# Patient Record
Sex: Female | Born: 1955 | Race: Black or African American | Hispanic: No | Marital: Married | Smoking: Never smoker
Health system: Southern US, Community
[De-identification: ages and names within clinical notes are randomized; demographics above are authoritative.]

## PROBLEM LIST (undated history)

## (undated) DIAGNOSIS — Z9889 Other specified postprocedural states: Secondary | ICD-10-CM

## (undated) DIAGNOSIS — M1990 Unspecified osteoarthritis, unspecified site: Secondary | ICD-10-CM

## (undated) DIAGNOSIS — K8020 Calculus of gallbladder without cholecystitis without obstruction: Secondary | ICD-10-CM

## (undated) DIAGNOSIS — K219 Gastro-esophageal reflux disease without esophagitis: Secondary | ICD-10-CM

## (undated) DIAGNOSIS — Z8489 Family history of other specified conditions: Secondary | ICD-10-CM

## (undated) DIAGNOSIS — F419 Anxiety disorder, unspecified: Secondary | ICD-10-CM

## (undated) DIAGNOSIS — R112 Nausea with vomiting, unspecified: Secondary | ICD-10-CM

## (undated) DIAGNOSIS — K635 Polyp of colon: Secondary | ICD-10-CM

## (undated) DIAGNOSIS — D649 Anemia, unspecified: Secondary | ICD-10-CM

## (undated) HISTORY — DX: Anxiety disorder, unspecified: F41.9

## (undated) HISTORY — DX: Gastro-esophageal reflux disease without esophagitis: K21.9

## (undated) HISTORY — DX: Unspecified osteoarthritis, unspecified site: M19.90

## (undated) HISTORY — DX: Polyp of colon: K63.5

## (undated) HISTORY — DX: Calculus of gallbladder without cholecystitis without obstruction: K80.20

## (undated) HISTORY — PX: TUBAL LIGATION: SHX77

## (undated) HISTORY — PX: OTHER SURGICAL HISTORY: SHX169

---
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
## 1999-02-08 ENCOUNTER — Ambulatory Visit (HOSPITAL_COMMUNITY): Admission: RE | Admit: 1999-02-08 | Discharge: 1999-02-08 | Payer: Self-pay | Admitting: Family Medicine

## 1999-10-27 ENCOUNTER — Other Ambulatory Visit: Admission: RE | Admit: 1999-10-27 | Discharge: 1999-10-27 | Payer: Self-pay | Admitting: Obstetrics and Gynecology

## 2000-08-06 ENCOUNTER — Encounter: Admission: RE | Admit: 2000-08-06 | Discharge: 2000-08-06 | Payer: Self-pay | Admitting: Family Medicine

## 2000-08-06 ENCOUNTER — Ambulatory Visit (HOSPITAL_COMMUNITY): Admission: RE | Admit: 2000-08-06 | Discharge: 2000-08-06 | Payer: Self-pay | Admitting: Family Medicine

## 2000-08-29 ENCOUNTER — Other Ambulatory Visit: Admission: RE | Admit: 2000-08-29 | Discharge: 2000-08-29 | Payer: Self-pay | Admitting: Obstetrics and Gynecology

## 2000-11-20 ENCOUNTER — Encounter: Admission: RE | Admit: 2000-11-20 | Discharge: 2000-11-20 | Payer: Self-pay | Admitting: Family Medicine

## 2000-12-25 HISTORY — PX: KNEE ARTHROSCOPY: SUR90

## 2001-01-30 ENCOUNTER — Ambulatory Visit (HOSPITAL_BASED_OUTPATIENT_CLINIC_OR_DEPARTMENT_OTHER): Admission: RE | Admit: 2001-01-30 | Discharge: 2001-01-30 | Payer: Self-pay | Admitting: Orthopedic Surgery

## 2001-04-02 ENCOUNTER — Encounter: Admission: RE | Admit: 2001-04-02 | Discharge: 2001-04-02 | Payer: Self-pay | Admitting: Family Medicine

## 2003-01-14 ENCOUNTER — Encounter (INDEPENDENT_AMBULATORY_CARE_PROVIDER_SITE_OTHER): Payer: Self-pay | Admitting: Specialist

## 2003-01-14 ENCOUNTER — Encounter: Admission: RE | Admit: 2003-01-14 | Discharge: 2003-01-14 | Payer: Self-pay | Admitting: Family Medicine

## 2003-01-14 ENCOUNTER — Ambulatory Visit (HOSPITAL_COMMUNITY): Admission: RE | Admit: 2003-01-14 | Discharge: 2003-01-14 | Payer: Self-pay | Admitting: Family Medicine

## 2003-01-23 ENCOUNTER — Encounter: Admission: RE | Admit: 2003-01-23 | Discharge: 2003-01-23 | Payer: Self-pay | Admitting: Family Medicine

## 2003-01-23 ENCOUNTER — Encounter: Payer: Self-pay | Admitting: Family Medicine

## 2003-08-27 ENCOUNTER — Encounter: Admission: RE | Admit: 2003-08-27 | Discharge: 2003-08-27 | Payer: Self-pay | Admitting: Family Medicine

## 2003-09-11 ENCOUNTER — Encounter: Payer: Self-pay | Admitting: Emergency Medicine

## 2003-09-11 ENCOUNTER — Emergency Department (HOSPITAL_COMMUNITY): Admission: EM | Admit: 2003-09-11 | Discharge: 2003-09-11 | Payer: Self-pay | Admitting: Emergency Medicine

## 2003-09-15 ENCOUNTER — Encounter: Admission: RE | Admit: 2003-09-15 | Discharge: 2003-10-26 | Payer: Self-pay | Admitting: Family Medicine

## 2003-09-15 ENCOUNTER — Encounter: Payer: Self-pay | Admitting: Family Medicine

## 2003-09-15 ENCOUNTER — Encounter: Admission: RE | Admit: 2003-09-15 | Discharge: 2003-09-15 | Payer: Self-pay | Admitting: Family Medicine

## 2003-10-13 ENCOUNTER — Encounter: Admission: RE | Admit: 2003-10-13 | Discharge: 2003-10-13 | Payer: Self-pay | Admitting: Family Medicine

## 2003-10-13 ENCOUNTER — Encounter: Payer: Self-pay | Admitting: Family Medicine

## 2004-02-14 ENCOUNTER — Emergency Department (HOSPITAL_COMMUNITY): Admission: EM | Admit: 2004-02-14 | Discharge: 2004-02-14 | Payer: Self-pay | Admitting: Emergency Medicine

## 2005-03-29 ENCOUNTER — Encounter: Admission: RE | Admit: 2005-03-29 | Discharge: 2005-03-29 | Payer: Self-pay | Admitting: Family Medicine

## 2005-03-29 ENCOUNTER — Emergency Department (HOSPITAL_COMMUNITY): Admission: EM | Admit: 2005-03-29 | Discharge: 2005-03-29 | Payer: Self-pay | Admitting: Family Medicine

## 2005-03-30 ENCOUNTER — Emergency Department (HOSPITAL_COMMUNITY): Admission: EM | Admit: 2005-03-30 | Discharge: 2005-03-30 | Payer: Self-pay | Admitting: Family Medicine

## 2005-04-07 ENCOUNTER — Encounter (INDEPENDENT_AMBULATORY_CARE_PROVIDER_SITE_OTHER): Payer: Self-pay | Admitting: *Deleted

## 2005-04-07 ENCOUNTER — Ambulatory Visit: Payer: Self-pay | Admitting: Family Medicine

## 2005-04-14 ENCOUNTER — Encounter: Admission: RE | Admit: 2005-04-14 | Discharge: 2005-04-14 | Payer: Self-pay | Admitting: Sports Medicine

## 2005-04-26 ENCOUNTER — Ambulatory Visit: Payer: Self-pay | Admitting: Family Medicine

## 2005-04-29 ENCOUNTER — Encounter (INDEPENDENT_AMBULATORY_CARE_PROVIDER_SITE_OTHER): Payer: Self-pay | Admitting: *Deleted

## 2005-04-29 LAB — CONVERTED CEMR LAB

## 2005-10-04 ENCOUNTER — Ambulatory Visit: Payer: Self-pay | Admitting: Family Medicine

## 2006-02-21 ENCOUNTER — Ambulatory Visit (HOSPITAL_BASED_OUTPATIENT_CLINIC_OR_DEPARTMENT_OTHER): Admission: RE | Admit: 2006-02-21 | Discharge: 2006-02-21 | Payer: Self-pay | Admitting: Orthopedic Surgery

## 2006-05-15 ENCOUNTER — Ambulatory Visit: Payer: Self-pay | Admitting: Sports Medicine

## 2006-05-22 ENCOUNTER — Ambulatory Visit: Payer: Self-pay | Admitting: Sports Medicine

## 2006-12-25 HISTORY — PX: CARPAL TUNNEL RELEASE: SHX101

## 2007-02-21 DIAGNOSIS — E669 Obesity, unspecified: Secondary | ICD-10-CM | POA: Insufficient documentation

## 2007-02-21 DIAGNOSIS — N949 Unspecified condition associated with female genital organs and menstrual cycle: Secondary | ICD-10-CM

## 2007-02-21 DIAGNOSIS — N938 Other specified abnormal uterine and vaginal bleeding: Secondary | ICD-10-CM | POA: Insufficient documentation

## 2007-02-21 DIAGNOSIS — K219 Gastro-esophageal reflux disease without esophagitis: Secondary | ICD-10-CM | POA: Insufficient documentation

## 2007-02-22 ENCOUNTER — Encounter (INDEPENDENT_AMBULATORY_CARE_PROVIDER_SITE_OTHER): Payer: Self-pay | Admitting: *Deleted

## 2007-02-25 ENCOUNTER — Ambulatory Visit: Payer: Self-pay | Admitting: Family Medicine

## 2007-02-25 DIAGNOSIS — H6120 Impacted cerumen, unspecified ear: Secondary | ICD-10-CM | POA: Insufficient documentation

## 2007-03-05 ENCOUNTER — Telehealth: Payer: Self-pay | Admitting: *Deleted

## 2007-06-24 ENCOUNTER — Telehealth: Payer: Self-pay | Admitting: *Deleted

## 2007-07-09 ENCOUNTER — Encounter (INDEPENDENT_AMBULATORY_CARE_PROVIDER_SITE_OTHER): Payer: Self-pay | Admitting: *Deleted

## 2007-07-12 ENCOUNTER — Encounter: Payer: Self-pay | Admitting: Family Medicine

## 2007-12-05 ENCOUNTER — Telehealth: Payer: Self-pay | Admitting: Family Medicine

## 2008-05-28 ENCOUNTER — Telehealth: Payer: Self-pay | Admitting: *Deleted

## 2008-09-28 ENCOUNTER — Telehealth: Payer: Self-pay | Admitting: *Deleted

## 2010-04-06 ENCOUNTER — Other Ambulatory Visit: Admission: RE | Admit: 2010-04-06 | Discharge: 2010-04-06 | Payer: Self-pay | Admitting: Family Medicine

## 2011-04-03 ENCOUNTER — Other Ambulatory Visit: Payer: Self-pay | Admitting: Family Medicine

## 2011-04-03 DIAGNOSIS — Z1231 Encounter for screening mammogram for malignant neoplasm of breast: Secondary | ICD-10-CM

## 2011-04-11 ENCOUNTER — Other Ambulatory Visit: Payer: Self-pay | Admitting: Family Medicine

## 2011-04-11 ENCOUNTER — Ambulatory Visit
Admission: RE | Admit: 2011-04-11 | Discharge: 2011-04-11 | Disposition: A | Discharge status: Home / Self Care | Payer: BLUE CROSS/BLUE SHIELD | Source: Ambulatory Visit | Attending: Family Medicine | Admitting: Family Medicine

## 2011-04-11 ENCOUNTER — Other Ambulatory Visit (HOSPITAL_COMMUNITY)
Admission: RE | Admit: 2011-04-11 | Discharge: 2011-04-11 | Disposition: A | Discharge status: Home / Self Care | Payer: BLUE CROSS/BLUE SHIELD | Source: Ambulatory Visit | Attending: Family Medicine | Admitting: Family Medicine

## 2011-04-11 DIAGNOSIS — Z124 Encounter for screening for malignant neoplasm of cervix: Secondary | ICD-10-CM | POA: Insufficient documentation

## 2011-04-11 DIAGNOSIS — Z1231 Encounter for screening mammogram for malignant neoplasm of breast: Secondary | ICD-10-CM

## 2011-05-12 NOTE — Op Note (Signed)
. [facility]  Patient:    Samantha Benton, Samantha Benton                         MRN: [id number] Proc. Date: 01/30/01 Adm. Date:  [id number] Attending:  Alinda Deem                           Operative Report  PREOPERATIVE DIAGNOSIS:  Left knee medial chondral lesion versus meniscal tear.  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  OPERATION PERFORMED:  Left knee arthroscopic partial lateral meniscectomy, debridement of chondromalacia and removal of loose bodies.  SURGEON:  Alinda Deem, M.D.  ASSISTANT:  Skip Mayer, PA-C  ANESTHESIA:  General endotracheal.  ESTIMATED BLOOD LOSS:  Minimal.  FLUID REPLACEMENT:  800 cc crystalloid.  DRAINS:  None.  TOURNIQUET TIME:  None.  INDICATIONS FOR PROCEDURE:  The patient is a 55 year old year old woman injured at work, who sustained a blow to her left knee with residual medial jointline pain.  She has been treated conservatively with observation, anti-inflammatory medicines and has not responded.  She has exquisite medial jointline pain with mechanical symptoms and desires arthroscopic evaluation and treatment of her left knee.  DESCRIPTION OF PROCEDURE:  The patient was identified by arm band and taken to the operating room at [facility] where the appropriate anesthetic monitors were attached and general endotracheal anesthesia induced with the patient in the supine position.  A lateral post was applied to the table and the left lower extremity prepped and draped in the usual sterile fashion from the ankle to the midthigh.  Using a #11 blade standard inferomedial and inferolateral peripatellar portals were then made allowing introduction of the arthroscope through the inferolateral portal and the outflow through the inferomedial  portal.  The pressure was set between 60 and 80 mmHg on the Linvatek pump and the left knee filled with normal saline solution. Diagnostic arthroscopy revealed that the suprapatellar pouch, patella and trochlea were in excellent condition.  In the medial compartment we encountered multiple cartilaginous loose bodies, some up to 1 cm in size that were removed with pituitary rongeurs or the outflow or the 3.5 gator sucker shaver.  We identified grade 3 to 4 chondromalacia of the medial tibial condyle which was debrided back to stable margins.  The meniscus itself was in good condition as were the ACL and the PCL.  On the lateral side, grade 3 chondromalacia of the lateral tibial condyle was identified and debrided.  We also took out a couple of loose bodies from the lateral side that were floating around in the joint fluid and were probably from the medial side donor site.  Degenerative tearing of the lateral meniscus was also noted and debrided back to a stable margin. The scope was taken to the medial and lateral gutters and no further significant loose bodies were encountered and the posterior compartments were cleared by taking the scope medial and lateral to the PCL.  At this point the knee was washed out with normal saline solution.  The arthroscopic instruments were removed.  A dressing of Xeroform, 4 x 8 dressing sponges, Webril and an Ace wrap applied.  The patient was awakened and taken to the recovery room without difficulty. DD:  01/30/01 TD:  01/31/01 Job: [id number]

## 2011-05-12 NOTE — Op Note (Signed)
Samantha Benton, Samantha Benton                 ACCOUNT NO.:  [id number]   MEDICAL RECORD NO.:  [id number]          PATIENT TYPE:  AMB   LOCATION:  DSC                          FACILITY:  [facility]   PHYSICIAN:  Cindee Salt, M.D.       DATE OF BIRTH:  Sep 13, 1956   DATE OF PROCEDURE:  02/21/2006  DATE OF DISCHARGE:                                 OPERATIVE REPORT   PREOPERATIVE DIAGNOSIS:  Carpal tunnel syndrome, right hand.   POSTOPERATIVE DIAGNOSIS:  Carpal tunnel syndrome, right hand.   OPERATION:  Decompression right median nerve.   SURGEON:  Kuzma   ASSISTANT:  Inman R.N.   ANESTHESIA:  Forearm based IV regional, conversion to general.   HISTORY:  The patient is a 55 year old female with a history of carpal  tunnel syndrome, EMG nerve conductions positive which has not responded to  conservative treatment.   PROCEDURE:  The patient is brought to the operating room where a IV regional  anesthetic was attempted. This was unsuccessful. She was prepped using  DuraPrep, supine position after general anesthetic was given.  Following  adequate anesthesia, the prepping and draping, a longitudinal incision was  made in the skin after exsanguination of the limb with an Esmarch bandage.  Tourniquet placed on the forearm was inflated to 225 mmHg. A longitudinal  incision was made and in the palm and carried down through subcutaneous  tissue. Bleeders were electrocauterized palmar fascia was split, superficial  palmar arch identified. The flexor tendon to the ring and little finger  identified. To the ulnar side of median nerve carpal retinaculum was incised  with sharp dissection. A right angle and Sewall retractor placed between  skin and forearm fascia. The fascia was released for approximately a  centimeter and a half proximal to the wrist crease under direct vision. The  canal was explored.  Area of compression of the nerve was immediately  apparent. A small persistent median artery was also  present. No further  lesions were identified. The wound was irrigated. Skin was closed with  interrupted 5-0 nylon sutures. A sterile compressive dressing and splint was  applied. The patient tolerated the procedure well was taken to the recovery  room for observation in satisfactory condition. She is discharged home to  return to the [facility] in 1 week on Vicodin.           ______________________________  Cindee Salt, M.D.     GK/MEDQ  D:  02/21/2006  T:  02/21/2006  Job:  [id number]

## 2012-01-25 ENCOUNTER — Encounter: Payer: Self-pay | Admitting: Family Medicine

## 2012-01-25 ENCOUNTER — Telehealth: Payer: Self-pay | Admitting: *Deleted

## 2012-01-25 ENCOUNTER — Ambulatory Visit (INDEPENDENT_AMBULATORY_CARE_PROVIDER_SITE_OTHER): Payer: BLUE CROSS/BLUE SHIELD | Admitting: Family Medicine

## 2012-01-25 VITALS — BP 110/74 | HR 82 | Ht 60.0 in | Wt 145.7 lb

## 2012-01-25 DIAGNOSIS — R109 Unspecified abdominal pain: Secondary | ICD-10-CM | POA: Insufficient documentation

## 2012-01-25 DIAGNOSIS — R079 Chest pain, unspecified: Secondary | ICD-10-CM

## 2012-01-25 DIAGNOSIS — Z0000 Encounter for general adult medical examination without abnormal findings: Secondary | ICD-10-CM | POA: Insufficient documentation

## 2012-01-25 DIAGNOSIS — Z124 Encounter for screening for malignant neoplasm of cervix: Secondary | ICD-10-CM

## 2012-01-25 LAB — CBC
Hemoglobin: 12 g/dL (ref 12.0–15.0)
MCH: 28.4 pg (ref 26.0–34.0)
MCV: 90.3 fL (ref 78.0–100.0)
RBC: 4.22 MIL/uL (ref 3.87–5.11)
WBC: 6.5 10*3/uL (ref 4.0–10.5)

## 2012-01-25 LAB — TSH: TSH: 0.482 u[IU]/mL (ref 0.350–4.500)

## 2012-01-25 LAB — COMPREHENSIVE METABOLIC PANEL
ALT: 9 U/L (ref 0–35)
CO2: 27 mEq/L (ref 19–32)
Calcium: 10 mg/dL (ref 8.4–10.5)
Chloride: 103 mEq/L (ref 96–112)
Potassium: 4 mEq/L (ref 3.5–5.3)
Sodium: 139 mEq/L (ref 135–145)
Total Protein: 6.7 g/dL (ref 6.0–8.3)

## 2012-01-25 LAB — LIPID PANEL: Cholesterol: 200 mg/dL (ref 0–200)

## 2012-01-25 MED ORDER — NITROGLYCERIN 0.3 MG SL SUBL: 0.3000 mg | SUBLINGUAL_TABLET | SUBLINGUAL | Status: DC | PRN

## 2012-01-25 NOTE — Telephone Encounter (Signed)
Called and informed pt of appt with [facility] for 02.14.2013 @ 3 pm [address] [phone]. Pt informed that if she cannot keep this appt she will need to call their office 24 hours in advance to cancel/reschedule or she may be charged a fee. Pt understood and agreed.Loralee Pacas Soquel

## 2012-01-25 NOTE — Progress Notes (Signed)
  Subjective:    Patient ID: Samantha Benton, female    DOB: 03-Jun-1956, 56 y.o.   MRN: [id number]  HPI  Annual Gynecological Exam   Wt Readings from Last 3 Encounters:  01/25/12 145 lb 11.2 oz (66.089 kg)  02/25/07 148 lb (67.132 kg)   Last period:  menopause- about a year ago Regular periods: no  Heavy bleeding: no Sexually active: yes Birth control or hormonal therapy:no  Hx of STD: Patient desires STD screening Dyspareunia: No Hot flashes: yes hot flashes every night, bothers her about 5/10. Vaginal discharge:  no Dysuria:No  Last mammogram: April 2012 Breast mass or concerns: No Last Pap: 2012 History of abnormal pap: No  FH of breast, uterine, ovarian, colon cancer: breast cancer in mother  Patient Information Form: Screening and ROS  AUDIT-C Score: 0 Do you feel safe in relationships? yes PHQ-2:negative  Review of Symptoms  General:  Negative for unexplained weight loss, fever Skin: Negative for new or changing mole, sore that won't heal HEENT: Negative for trouble hearing, trouble seeing, ringing in ears, mouth sores, hoarseness, change in voice, dysphagia. CV:  Negative for dyspnea, edema, palpitations Resp: Negative for cough, dyspnea, hemoptysis GI: Negative for nausea, vomiting, diarrhea, constipation, melena, hematochezia. GU: Negative for dysuria, incontinence, urinary hesitance, hematuria, vaginal , polyuria, sexual difficulty, lumps in testicle or breasts MSK: Negative for muscle cramps or aches or swelling, Neuro: Negative for headaches, weakness, numbness, dizziness, passing out/fainting Psych: Negative for depression, anxiety, memory problems  Positive for chest pain- 3-4 times a week, nonexertional, lasts 5-10 minutes, lays down.  Feels like chest squeezing, right shoulder and sometimes left breast area.  Abdominal pain: 2-3 weeks.  Stabbing pain.  No pattern.  No relation to foods.    Upper back pain: sore right upper back.  2-3  weeks.   Review of Systems See above    Objective:   Physical Exam GEN: Alert & Oriented, No acute distress Neck: supple, no LAD, no JVD, no thyromegaly. CV:  Regular Rate & Rhythm, no murmur Respiratory:  Normal work of breathing, CTAB Abd:  + BS, soft, no tenderness to palpation Ext: no pre-tibial edema Pelvic Exam:        External: normal female genitalia without lesions or masses        Vagina: normal without lesions or masses        Cervix: normal without lesions or masses        Adnexa: normal bimanual exam without masses or fullness        Uterus: normal by palpation Breast exam, no masses or skin changes.       Assessment & Plan:

## 2012-01-25 NOTE — Patient Instructions (Signed)
It's very nice to meet you  Check on your tetanus shot- need every 10 years  Due for mammogram in April  Will refer you to cardiology for a stress test  Follow-up to discuss abdominal pain or other concerns in more detail

## 2012-01-25 NOTE — Assessment & Plan Note (Signed)
Screening UTD.  Declines immunizations today.

## 2012-01-25 NOTE — Assessment & Plan Note (Signed)
Non acute, no obvious cause.  Will return for further workup

## 2012-01-25 NOTE — Assessment & Plan Note (Signed)
Atypical chest pain in setting of mother and sister with early CAD.  Gave info on chest pain, NTG and aspirin and how to use.  Will refer to cardiology for stress testing.

## 2012-02-08 ENCOUNTER — Ambulatory Visit (INDEPENDENT_AMBULATORY_CARE_PROVIDER_SITE_OTHER): Payer: BLUE CROSS/BLUE SHIELD | Admitting: Cardiology

## 2012-02-08 ENCOUNTER — Encounter: Payer: Self-pay | Admitting: Cardiology

## 2012-02-08 DIAGNOSIS — R079 Chest pain, unspecified: Secondary | ICD-10-CM

## 2012-02-08 DIAGNOSIS — R42 Dizziness and giddiness: Secondary | ICD-10-CM

## 2012-02-08 NOTE — Patient Instructions (Signed)
Your physician has requested that you have a lexiscan myoview. For further information please visit [email or web address]. Please follow instruction sheet, as given.  The current medical regimen is effective;  continue present plan and medications.  Follow up will be based on these results  

## 2012-02-08 NOTE — Progress Notes (Signed)
HPI The patient presents for evaluation of chest pain. She had this same pain in 1976 and saw a cardiologist but she apparently had no disease at bedtime. She said that she is now having chest discomfort for the past month. It is sporadic. It happens at rest and can wake her from her sleep. She says it's deep under her right breast. It is a pressure discomfort. He can be 9/10. It doesn't radiate to her jaw or to her arms. She does get some discomfort up into her throat. She has tried to drink water to get that of this. It is not positional and not worse with inspiration. She cannot bring it on. 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 She does think it might be getting worse. She's not describing associated diaphoresis or shortness. She has no PND or orthopnea. She's not describing palpitations, presyncope or syncope.  Allergies  Allergen Reactions  . Penicillins Hives and Swelling    Throat swelling    No current outpatient prescriptions on file.    Past Medical History  Diagnosis Date  . Anxiety   . Arthritis     right knee    Past Surgical History  Procedure Date  . Cholecystectomy 1970  . Carpal tunnel release 2008  . Knee arthroscopy 2002    right knee  . Tubal ligation     Family History  Problem Relation Age of Onset  . Heart disease Mother 19    MI  . Cancer Mother 59    breast ca  . Deep vein thrombosis Father   . Heart disease Sister 57    MI    History   Social History  . Marital Status: Single    Spouse Name: N/A    Number of Children: N/A  . Years of Education: N/A   Occupational History  . housekeeping [employer]   Social History Main Topics  . Smoking status: Never Smoker   . Smokeless tobacco: Not on file  . Alcohol Use: No  . Drug Use: No  . Sexually Active:    Other Topics Concern  . Not on file   Social History Narrative   Lives with husband.  2 children- grown.  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 Leg pain and knee pain.  Otherwise as stated in the HPI and negative for all other systems.  PHYSICAL EXAM BP 125/75  Pulse 85  Ht 5' (1.524 m)  Wt 145 lb (65.772 kg)  BMI 28.32 kg/m2 GENERAL:  Well appearing, denture HEENT:  Pupils equal round and reactive, fundi not visualized, oral mucosa unremarkable NECK:  No jugular venous distention, waveform within normal limits, carotid upstroke brisk and symmetric, no bruits, no thyromegaly LYMPHATICS:  No cervical, inguinal adenopathy LUNGS:  Clear to auscultation bilaterally BACK:  No CVA tenderness CHEST:  Unremarkable HEART:  PMI not displaced or sustained,S1 and S2 within normal limits, no S3, no S4, no clicks, no rubs, no murmurs ABD:  Flat, positive bowel sounds normal in frequency in pitch, no bruits, no rebound, no guarding, no midline pulsatile mass, no hepatomegaly, no splenomegaly EXT:  2 plus pulses throughout, no edema, no cyanosis no clubbing SKIN:  No rashes no nodules NEURO:  Cranial nerves II through XII grossly intact, motor grossly intact throughout PSYCH:  Cognitively intact, oriented to person place and time  EKG:  Sinus  rhythm, rate 78, axis within normal limits, intervals within normal limits, no acute ST-T wave changes.  ASSESSMENT AND PLAN

## 2012-02-08 NOTE — Assessment & Plan Note (Signed)
Her chest pain has some atypical and some atypical features. He has a very concerning family history. She needs screening stress testing. However, because of knee pain she would not be able to walk on the treadmill. Therefore, she will have a [employer].

## 2012-02-08 NOTE — Assessment & Plan Note (Signed)
This is mild. She did have some minimal orthostatic blood pressure drop while seated but not while standing. At this point no further cardiovascular testing is planned.

## 2012-02-12 ENCOUNTER — Encounter: Payer: Self-pay | Admitting: Family Medicine

## 2012-02-13 ENCOUNTER — Ambulatory Visit (HOSPITAL_COMMUNITY): Payer: BC Managed Care – PPO | Attending: Cardiology | Admitting: Radiology

## 2012-02-13 DIAGNOSIS — Z8249 Family history of ischemic heart disease and other diseases of the circulatory system: Secondary | ICD-10-CM | POA: Insufficient documentation

## 2012-02-13 DIAGNOSIS — R42 Dizziness and giddiness: Secondary | ICD-10-CM

## 2012-02-13 DIAGNOSIS — R079 Chest pain, unspecified: Secondary | ICD-10-CM

## 2012-02-13 MED ORDER — TECHNETIUM TC 99M TETROFOSMIN IV KIT
33.0000 | PACK | Freq: Once | INTRAVENOUS | Status: AC | PRN
  Administered 2012-02-13: 33 via INTRAVENOUS

## 2012-02-13 MED ORDER — REGADENOSON 0.4 MG/5ML IV SOLN
0.4000 mg | Freq: Once | INTRAVENOUS | Status: AC
  Administered 2012-02-13: 0.4 mg via INTRAVENOUS

## 2012-02-13 MED ORDER — TECHNETIUM TC 99M TETROFOSMIN IV KIT
10.5000 | PACK | Freq: Once | INTRAVENOUS | Status: AC | PRN
  Administered 2012-02-13: 11 via INTRAVENOUS

## 2012-02-13 NOTE — Progress Notes (Signed)
[facility] SITE 3 NUCLEAR MED [address] [place] [address] [phone]  Cardiology Nuclear Med Study  Samantha Benton is a 56 y.o. female [id number] 1956-05-05   Nuclear Med Background Indication for Stress Test:  Evaluation for Ischemia History:  No previous documented CAD Cardiac Risk Factors: Family History - CAD  Symptoms:  Chest Pain and Dizziness   Nuclear Pre-Procedure Caffeine/Decaff Intake:  None NPO After: 6:00pm yesterday   Lungs:  clear IV 0.9% NS with Angio Cath:  20g  IV Site: R Antecubital x 1, tolerated well IV Started by:  Irean Hong, RN  Chest Size (in):  36 Cup Size: C  Height: 5' (1.524 m)  Weight:  142 lb (64.411 kg)  BMI:  Body mass index is 27.73 kg/(m^2). Tech Comments:  n/a    Nuclear Med Study 1 or 2 day study: 1 day  Stress Test Type:  Treadmill/Lexiscan  Reading MD: Olga Millers, MD  Order Authorizing Provider:  Rollene Rotunda, MD  Resting Radionuclide: Technetium 42m Tetrofosmin  Resting Radionuclide Dose: 10.5 mCi   Stress Radionuclide:  Technetium 22m Tetrofosmin  Stress Radionuclide Dose: 33.0 mCi           Stress Protocol Rest HR: 78 Stress HR: 137  Rest BP: 121/76 Stress BP: 163/71  Exercise Time (min): n/a METS: n/a   Predicted Max HR: 165 bpm % Max HR: 83.03 bpm Rate Pressure Product: 21308   Dose of Adenosine (mg):  n/a Dose of Lexiscan: 0.4 mg  Dose of Atropine (mg): n/a Dose of Dobutamine: n/a mcg/kg/min (at max HR)  Stress Test Technologist: Milana Na, EMT-P  Nuclear Technologist:  Doyne Keel, CNMT     Rest Procedure:  Myocardial perfusion imaging was performed at rest 45 minutes following the intravenous administration of Technetium 2m Tetrofosmin. Rest ECG: NSR  Stress Procedure:  The patient received IV Lexiscan 0.4 mg over 15-seconds with concurrent low level exercise and then Technetium 26m Tetrofosmin was injected at 30-seconds while the patient continued walking one more  minute.  There were non specific changes, + headache, throat discomfort, and woozyxc with Lexiscan.  Quantitative spect images were obtained after a 45-minute delay. Stress ECG: No significant ST segment change suggestive of ischemia.  QPS Raw Data Images:  Acquisition technically good; normal left ventricular size. Stress Images:  There is decreased uptake in the apex. Rest Images:  There is decreased uptake in the apex. Subtraction (SDS):  No evidence of ischemia. Transient Ischemic Dilatation (Normal <1.22):  1.01 Lung/Heart Ratio (Normal <0.45):  0.42  Quantitative Gated Spect Images QGS EDV:  61 ml QGS ESV:  20 ml QGS cine images:  NL LV Function; NL Wall Motion QGS EF: 68%  Impression Exercise Capacity:  Lexiscan with no exercise. BP Response:  Normal blood pressure response. Clinical Symptoms:  There is throat discomfort. ECG Impression:  No significant ST segment change suggestive of ischemia. Comparison with Prior Nuclear Study: No previous nuclear study performed  Overall Impression:  Normal stress nuclear study with a small fixed apical defect suggestive of thinning; no ischemia.  Olga Millers

## 2012-02-27 ENCOUNTER — Telehealth: Payer: Self-pay | Admitting: Cardiology

## 2012-02-27 NOTE — Telephone Encounter (Signed)
Fu call °Patient returning your call °

## 2012-02-27 NOTE — Telephone Encounter (Signed)
Pt aware of results of nuc study 

## 2012-04-22 ENCOUNTER — Ambulatory Visit: Payer: Self-pay | Admitting: Specialist

## 2012-04-22 LAB — CBC
HCT: 36.7 % (ref 35.0–47.0)
HGB: 11.8 g/dL — ABNORMAL LOW (ref 12.0–16.0)
MCH: 28.9 pg (ref 26.0–34.0)
MCHC: 32.2 g/dL (ref 32.0–36.0)
MCV: 90 fL (ref 80–100)
Platelet: 228 10*3/uL (ref 150–440)
RBC: 4.09 10*6/uL (ref 3.80–5.20)
RDW: 15.5 % — ABNORMAL HIGH (ref 11.5–14.5)
WBC: 5.3 10*3/uL (ref 3.6–11.0)

## 2012-04-22 LAB — URINALYSIS, COMPLETE
Bilirubin,UR: NEGATIVE
Blood: NEGATIVE
Glucose,UR: NEGATIVE mg/dL (ref 0–75)
Ketone: NEGATIVE
Leukocyte Esterase: NEGATIVE
Nitrite: NEGATIVE
Ph: 8 (ref 4.5–8.0)
RBC,UR: NONE SEEN /HPF (ref 0–5)
Specific Gravity: 1.008 (ref 1.003–1.030)
Squamous Epithelial: NONE SEEN
WBC UR: <1 /HPF (ref 0–5)

## 2012-04-22 LAB — BASIC METABOLIC PANEL
Anion Gap: 5 — ABNORMAL LOW (ref 7–16)
BUN: 10 mg/dL (ref 7–18)
Calcium, Total: 9.5 mg/dL (ref 8.5–10.1)
Chloride: 107 mmol/L (ref 98–107)
Co2: 29 mmol/L (ref 21–32)
Creatinine: 0.57 mg/dL — ABNORMAL LOW (ref 0.60–1.30)
Sodium: 141 mmol/L (ref 136–145)

## 2012-04-22 LAB — MRSA PCR SCREENING

## 2012-04-22 LAB — PROTIME-INR: INR: 1

## 2012-04-30 ENCOUNTER — Inpatient Hospital Stay: Payer: Self-pay | Admitting: Specialist

## 2012-05-01 LAB — CBC WITH DIFFERENTIAL/PLATELET
Basophil %: 0 %
Eosinophil #: 0 10*3/uL (ref 0.0–0.7)
HCT: 32.6 % — ABNORMAL LOW (ref 35.0–47.0)
HGB: 10.5 g/dL — ABNORMAL LOW (ref 12.0–16.0)
MCH: 29 pg (ref 26.0–34.0)
MCHC: 32.3 g/dL (ref 32.0–36.0)
Monocyte #: 0.7 x10 3/mm (ref 0.2–0.9)
Neutrophil #: 8.3 10*3/uL — ABNORMAL HIGH (ref 1.4–6.5)
Platelet: 175 10*3/uL (ref 150–440)

## 2012-05-01 LAB — BASIC METABOLIC PANEL
Anion Gap: 6 — ABNORMAL LOW (ref 7–16)
Calcium, Total: 8.9 mg/dL (ref 8.5–10.1)
Chloride: 104 mmol/L (ref 98–107)
Co2: 29 mmol/L (ref 21–32)
Creatinine: 0.53 mg/dL — ABNORMAL LOW (ref 0.60–1.30)
EGFR (Non-African Amer.): >60
Glucose: 112 mg/dL — ABNORMAL HIGH (ref 65–99)
Potassium: 4.5 mmol/L (ref 3.5–5.1)
Sodium: 139 mmol/L (ref 136–145)

## 2012-05-02 LAB — HEMOGLOBIN: HGB: 9 g/dL — ABNORMAL LOW (ref 12.0–16.0)

## 2012-05-27 ENCOUNTER — Ambulatory Visit
Payer: BC Managed Care – PPO | Attending: Internal Medicine | Admitting: Rehabilitative and Restorative Service Providers"

## 2012-05-27 DIAGNOSIS — IMO0001 Reserved for inherently not codable concepts without codable children: Secondary | ICD-10-CM | POA: Insufficient documentation

## 2012-05-27 DIAGNOSIS — R269 Unspecified abnormalities of gait and mobility: Secondary | ICD-10-CM | POA: Insufficient documentation

## 2012-05-27 DIAGNOSIS — M6281 Muscle weakness (generalized): Secondary | ICD-10-CM | POA: Insufficient documentation

## 2012-05-27 DIAGNOSIS — M25569 Pain in unspecified knee: Secondary | ICD-10-CM | POA: Insufficient documentation

## 2012-05-29 ENCOUNTER — Ambulatory Visit: Payer: BC Managed Care – PPO | Admitting: Rehabilitative and Restorative Service Providers"

## 2012-05-30 ENCOUNTER — Ambulatory Visit: Payer: BC Managed Care – PPO | Admitting: Physical Therapy

## 2012-06-03 ENCOUNTER — Ambulatory Visit: Payer: BC Managed Care – PPO | Admitting: Rehabilitative and Restorative Service Providers"

## 2012-06-05 ENCOUNTER — Ambulatory Visit: Payer: BC Managed Care – PPO | Admitting: Physical Therapy

## 2012-06-06 ENCOUNTER — Ambulatory Visit: Payer: BC Managed Care – PPO | Admitting: Rehabilitative and Restorative Service Providers"

## 2012-06-10 ENCOUNTER — Ambulatory Visit: Payer: BC Managed Care – PPO | Admitting: Rehabilitative and Restorative Service Providers"

## 2012-06-12 ENCOUNTER — Ambulatory Visit: Payer: BC Managed Care – PPO | Admitting: Rehabilitative and Restorative Service Providers"

## 2012-06-14 ENCOUNTER — Ambulatory Visit: Payer: BC Managed Care – PPO | Admitting: Physical Therapy

## 2012-06-17 ENCOUNTER — Ambulatory Visit: Payer: BC Managed Care – PPO | Admitting: Physical Therapy

## 2012-06-18 ENCOUNTER — Ambulatory Visit: Payer: BC Managed Care – PPO | Admitting: Physical Therapy

## 2012-06-19 ENCOUNTER — Encounter: Payer: BC Managed Care – PPO | Admitting: Physical Therapy

## 2012-06-20 ENCOUNTER — Encounter: Payer: BC Managed Care – PPO | Admitting: Physical Therapy

## 2012-06-21 ENCOUNTER — Ambulatory Visit: Payer: BC Managed Care – PPO | Admitting: Physical Therapy

## 2012-06-24 ENCOUNTER — Encounter: Payer: BC Managed Care – PPO | Admitting: Physical Therapy

## 2012-06-25 ENCOUNTER — Ambulatory Visit: Payer: BC Managed Care – PPO | Attending: Family Medicine | Admitting: Physical Therapy

## 2012-06-25 DIAGNOSIS — M25569 Pain in unspecified knee: Secondary | ICD-10-CM | POA: Insufficient documentation

## 2012-06-25 DIAGNOSIS — IMO0001 Reserved for inherently not codable concepts without codable children: Secondary | ICD-10-CM | POA: Insufficient documentation

## 2012-06-25 DIAGNOSIS — M6281 Muscle weakness (generalized): Secondary | ICD-10-CM | POA: Insufficient documentation

## 2012-06-25 DIAGNOSIS — R269 Unspecified abnormalities of gait and mobility: Secondary | ICD-10-CM | POA: Insufficient documentation

## 2012-06-26 ENCOUNTER — Ambulatory Visit: Payer: BC Managed Care – PPO

## 2012-07-01 ENCOUNTER — Ambulatory Visit: Payer: BC Managed Care – PPO | Admitting: Rehabilitative and Restorative Service Providers"

## 2012-07-02 ENCOUNTER — Ambulatory Visit: Payer: BC Managed Care – PPO | Admitting: Physical Therapy

## 2012-07-03 ENCOUNTER — Ambulatory Visit: Payer: BC Managed Care – PPO | Admitting: Rehabilitative and Restorative Service Providers"

## 2012-07-08 ENCOUNTER — Ambulatory Visit: Payer: BC Managed Care – PPO | Admitting: Rehabilitative and Restorative Service Providers"

## 2012-07-10 ENCOUNTER — Ambulatory Visit: Payer: BC Managed Care – PPO | Admitting: Physical Therapy

## 2012-07-11 ENCOUNTER — Ambulatory Visit: Payer: BC Managed Care – PPO | Admitting: Rehabilitative and Restorative Service Providers"

## 2012-07-15 ENCOUNTER — Ambulatory Visit: Payer: BC Managed Care – PPO | Admitting: Physical Therapy

## 2012-07-17 ENCOUNTER — Ambulatory Visit: Payer: BC Managed Care – PPO | Admitting: Rehabilitative and Restorative Service Providers"

## 2012-07-18 ENCOUNTER — Ambulatory Visit: Payer: BC Managed Care – PPO | Admitting: Rehabilitative and Restorative Service Providers"

## 2012-07-22 ENCOUNTER — Ambulatory Visit: Payer: BC Managed Care – PPO | Admitting: Rehabilitative and Restorative Service Providers"

## 2012-07-24 ENCOUNTER — Ambulatory Visit: Payer: BC Managed Care – PPO | Admitting: Rehabilitative and Restorative Service Providers"

## 2012-07-25 ENCOUNTER — Encounter: Payer: BC Managed Care – PPO | Admitting: Rehabilitative and Restorative Service Providers"

## 2012-07-25 ENCOUNTER — Ambulatory Visit: Payer: BC Managed Care – PPO | Attending: Family Medicine | Admitting: Physical Therapy

## 2012-07-25 DIAGNOSIS — M6281 Muscle weakness (generalized): Secondary | ICD-10-CM | POA: Insufficient documentation

## 2012-07-25 DIAGNOSIS — M25569 Pain in unspecified knee: Secondary | ICD-10-CM | POA: Insufficient documentation

## 2012-07-25 DIAGNOSIS — R269 Unspecified abnormalities of gait and mobility: Secondary | ICD-10-CM | POA: Insufficient documentation

## 2012-07-25 DIAGNOSIS — IMO0001 Reserved for inherently not codable concepts without codable children: Secondary | ICD-10-CM | POA: Insufficient documentation

## 2012-07-30 ENCOUNTER — Ambulatory Visit: Payer: BC Managed Care – PPO | Admitting: Physical Therapy

## 2012-07-31 ENCOUNTER — Ambulatory Visit: Payer: BC Managed Care – PPO | Admitting: Rehabilitative and Restorative Service Providers"

## 2012-08-01 ENCOUNTER — Ambulatory Visit: Payer: BC Managed Care – PPO | Admitting: Rehabilitative and Restorative Service Providers"

## 2012-08-05 ENCOUNTER — Ambulatory Visit: Payer: BC Managed Care – PPO | Admitting: Physical Therapy

## 2012-08-07 ENCOUNTER — Ambulatory Visit: Payer: BC Managed Care – PPO | Admitting: Physical Therapy

## 2012-08-08 ENCOUNTER — Ambulatory Visit: Payer: BC Managed Care – PPO | Admitting: Physical Therapy

## 2012-08-12 ENCOUNTER — Ambulatory Visit: Payer: BC Managed Care – PPO | Admitting: Rehabilitative and Restorative Service Providers"

## 2012-08-14 ENCOUNTER — Encounter: Payer: BC Managed Care – PPO | Admitting: Rehabilitative and Restorative Service Providers"

## 2012-08-15 ENCOUNTER — Encounter: Payer: BC Managed Care – PPO | Admitting: Physical Therapy

## 2013-04-22 ENCOUNTER — Other Ambulatory Visit (HOSPITAL_COMMUNITY)
Admission: RE | Admit: 2013-04-22 | Discharge: 2013-04-22 | Disposition: A | Discharge status: Home / Self Care | Payer: BC Managed Care – PPO | Source: Ambulatory Visit | Attending: Family Medicine | Admitting: Family Medicine

## 2013-04-22 ENCOUNTER — Other Ambulatory Visit: Payer: Self-pay | Admitting: Family Medicine

## 2013-04-22 DIAGNOSIS — Z124 Encounter for screening for malignant neoplasm of cervix: Secondary | ICD-10-CM | POA: Insufficient documentation

## 2013-09-26 ENCOUNTER — Other Ambulatory Visit: Payer: Self-pay

## 2013-09-26 DIAGNOSIS — Z1231 Encounter for screening mammogram for malignant neoplasm of breast: Secondary | ICD-10-CM

## 2013-10-08 ENCOUNTER — Ambulatory Visit
Admission: RE | Admit: 2013-10-08 | Discharge: 2013-10-08 | Disposition: A | Discharge status: Home / Self Care | Payer: BC Managed Care – PPO | Source: Ambulatory Visit

## 2013-10-08 DIAGNOSIS — Z1231 Encounter for screening mammogram for malignant neoplasm of breast: Secondary | ICD-10-CM

## 2014-03-04 ENCOUNTER — Encounter (INDEPENDENT_AMBULATORY_CARE_PROVIDER_SITE_OTHER): Payer: Self-pay

## 2014-03-04 ENCOUNTER — Ambulatory Visit (INDEPENDENT_AMBULATORY_CARE_PROVIDER_SITE_OTHER): Payer: BC Managed Care – PPO | Admitting: Interventional Cardiology

## 2014-03-04 ENCOUNTER — Encounter: Payer: Self-pay | Admitting: Interventional Cardiology

## 2014-03-04 ENCOUNTER — Encounter: Payer: Self-pay | Admitting: Cardiology

## 2014-03-04 VITALS — BP 120/74 | HR 77 | Ht 60.0 in | Wt 161.8 lb

## 2014-03-04 DIAGNOSIS — R079 Chest pain, unspecified: Secondary | ICD-10-CM

## 2014-03-04 DIAGNOSIS — K219 Gastro-esophageal reflux disease without esophagitis: Secondary | ICD-10-CM

## 2014-03-04 DIAGNOSIS — R0602 Shortness of breath: Secondary | ICD-10-CM | POA: Insufficient documentation

## 2014-03-04 NOTE — Progress Notes (Signed)
Patient ID: Samantha Benton, female   DOB: 10-13-1956, 58 y.o.   MRN: [id number]     Patient ID: Samantha Benton MRN: [id number] DOB/AGE: 58-20-1957 58 y.o.   Referring Physician Dr. Jillyn HiddenFulp   Reason for Consultation chest pain/ SHOB/ fatigue  HPI: 58 y/o who has the above sx for 6 months.  Fatigue is the worst sx.  She feels a little better with more water consumption.  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  Her exercise is limited by a right knee problem.  Chest discomfort can come on at rest.  It relieves spontaneously or with rest.  CP comes on daily.  Episodes last 10-15 minutes.  She will have to change positions.  No sweating with the pain.  She has SHOB with walking.  Relieved with rest.  No orthopnea or PND.  Her sleep is not good.    Her sister died of an MI at age 58.    Current Outpatient Prescriptions  Medication Sig Dispense Refill  . diclofenac (VOLTAREN) 75 MG EC tablet Take 75 mg by mouth 2 (two) times daily.      Marland Kitchen. ibuprofen (ADVIL,MOTRIN) 200 MG tablet Take 200 mg by mouth every 6 (six) hours as needed.      Marland Kitchen. PYLERA 140-125-125 MG per capsule Take 3 capsules by mouth 4 (four) times daily -  before meals and at bedtime.       . ranitidine (ZANTAC) 150 MG tablet Take 150 mg by mouth 2 (two) times daily.       Marland Kitchen. sulfamethoxazole-trimethoprim (BACTRIM DS) 800-160 MG per tablet Take 1 tablet by mouth 2 (two) times daily.       . Vitamin D, Ergocalciferol, (DRISDOL) 50000 UNITS CAPS capsule Take 50,000 Units by mouth every 7 (seven) days.        No current facility-administered medications for this visit.   Past Medical History  Diagnosis Date  . Anxiety   . Arthritis     right knee  . GERD (gastroesophageal reflux disease)     Family History  Problem Relation Age of Onset  . Heart disease Mother 2163    MI  . Cancer Mother 1550    breast ca  . Deep vein thrombosis Father   . Heart disease Sister 6440    MI    History    Social History  . Marital Status: Single    Spouse Name: N/A    Number of Children: N/A  . 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 Smoking status: Never Smoker   . Smokeless tobacco: Not on file  . Alcohol Use: No  . Drug Use: No  . Sexual Activity: Not on file   Other Topics Concern  . Not on file   Social History Narrative   Lives with husband.  2 children- grown.  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 Cholecystectomy  1970  . Carpal tunnel release  2008  . Knee arthroscopy  2002    right knee  . Tubal ligation    . Gallstone removal        (Not in a hospital admission)  Review of systems complete and found to be negative unless listed above .  No nausea, vomiting.  No fever chills, No focal weakness,  No palpitations.  Physical Exam: Filed Vitals:  03/04/14 0911  BP: 120/74  Pulse: 77    Weight: 161 lb 12.8 oz (73.392 kg)  Physical exam:  [place]/AT EOMI No JVD, No carotid bruit RRR S1S2  No wheezing Soft. NT, nondistended No edema. No focal motor or sensory deficits Normal affect  Labs:   Lab Results  Component Value Date   WBC 6.5 01/25/2012   HGB 12.0 01/25/2012   HCT 38.1 01/25/2012   MCV 90.3 01/25/2012   PLT 258 01/25/2012   No results found for this basename: NA, K, CL, CO2, BUN, CREATININE, CALCIUM, LABALBU, PROT, BILITOT, ALKPHOS, ALT, AST, GLUCOSE,  in the last 168 hours No results found for this basename: CKTOTAL, CKMB, CKMBINDEX, TROPONINI    Lab Results  Component Value Date   CHOL 200 01/25/2012   Lab Results  Component Value Date   HDL 74 01/25/2012   Lab Results  Component Value Date   LDLCALC 117* 01/25/2012   Lab Results  Component Value Date   TRIG 44 01/25/2012   Lab Results  Component Value Date   CHOLHDL 2.7 01/25/2012   No results found for this basename: LDLDIRECT       EKG: Normal sinus rhythm, poor R wave  progression, ST T-wave flattening in the anterolateral leads  ASSESSMENT AND PLAN:  1. atypical chest pain: She does have risk factors for coronary artery disease including a significant family history with both her sister and her mother dying suddenly from MIs at young ages. We'll plan for stress test. She is unable to walk the treadmill do to prior left knee surgery and recurrent pain. We'll plan for pharmacologic Cardiolite stress test.  Will obtain ejection fraction at the time of stress test to evaluate if low EF could be the cause of shortness of breath.  2. of note, she does use chronic NSAIDs for her knee problem. We'll have to potentially adjust this if she is found to have heart disease given her family history.  3.  LDL was less than 130 at last check. Signed:   Fredric Mare, MD, [facility] 03/04/2014, 9:40 AM

## 2014-03-04 NOTE — Patient Instructions (Signed)
Your physician has requested that you have a lexiscan myoview. For further information please visit [email or web address]. Please follow instruction sheet, as given.   

## 2014-03-17 ENCOUNTER — Ambulatory Visit (HOSPITAL_COMMUNITY): Payer: BC Managed Care – PPO | Attending: Cardiology | Admitting: Radiology

## 2014-03-17 VITALS — BP 144/81 | HR 76 | Ht 60.0 in | Wt 160.0 lb

## 2014-03-17 DIAGNOSIS — R0602 Shortness of breath: Secondary | ICD-10-CM | POA: Insufficient documentation

## 2014-03-17 DIAGNOSIS — R079 Chest pain, unspecified: Secondary | ICD-10-CM

## 2014-03-17 MED ORDER — REGADENOSON 0.4 MG/5ML IV SOLN
0.4000 mg | Freq: Once | INTRAVENOUS | Status: AC
  Administered 2014-03-17: 0.4 mg via INTRAVENOUS

## 2014-03-17 MED ORDER — TECHNETIUM TC 99M SESTAMIBI GENERIC - CARDIOLITE
10.0000 | Freq: Once | INTRAVENOUS | Status: AC | PRN
  Administered 2014-03-17: 10 via INTRAVENOUS

## 2014-03-17 MED ORDER — TECHNETIUM TC 99M SESTAMIBI GENERIC - CARDIOLITE
30.0000 | Freq: Once | INTRAVENOUS | Status: AC | PRN
  Administered 2014-03-17: 30 via INTRAVENOUS

## 2014-03-17 NOTE — Progress Notes (Signed)
[facility] SITE 3 NUCLEAR MED [address] [place] [place], [place] [id number] [phone]    Cardiology Nuclear Med Study  Chip BoerDebora Beane is a 58 y.o. female     MRN : [id number]     DOB: 01-10-1956  Procedure Date: 03/17/2014  Nuclear Med Background Indication for Stress Test:  Evaluation for Ischemia and Abnormal EKG History:  No known CAD, MPI 2013 (scar) EF 68% Cardiac Risk Factors: Family History - CAD  Symptoms:  Chest Pain (last date of chest discomfort was one week ago), Fatigue and SOB   Nuclear Pre-Procedure Caffeine/Decaff Intake:  None > 12 hrs NPO After: 7:00pm   Lungs:  clear O2 Sat: 98% on room air. IV 0.9% NS with Angio Cath:  22g  IV Site: R Antecubital x 1, tolerated well IV Started by:  Irean HongPatsy Edwards, RN  Chest Size (in):  36 Cup Size: C  Height: 5' (1.524 m)  Weight:  160 lb (72.576 kg)  BMI:  Body mass index is 31.25 kg/(m^2). Tech Comments:  No medications today    Nuclear Med Study 1 or 2 day study: 1 day  Stress Test Type:  Treadmill/Lexiscan  Reading MD: N/A  Order Authorizing Provider:  Everette RankJay Varanasi, MD  Resting Radionuclide: Technetium 151m Sestamibi  Resting Radionuclide Dose: 11.0 mCi   Stress Radionuclide:  Technetium 171m Sestamibi  Stress Radionuclide Dose: 33.0 mCi           Stress Protocol Rest HR: 76 Stress HR: 127  Rest BP: 144/81 Stress BP: 178/69  Exercise Time (min): n/a METS: n/a           Dose of Adenosine (mg):  n/a Dose of Lexiscan: 0.4 mg  Dose of Atropine (mg): n/a Dose of Dobutamine: n/a mcg/kg/min (at max HR)  Stress Test Technologist: Nelson ChimesSharon Brooks, BS-ES  Nuclear Technologist:  Domenic PoliteStephen Carbone, CNMT     Rest Procedure:  Myocardial perfusion imaging was performed at rest 45 minutes following the intravenous administration of Technetium 421m Sestamibi. Rest ECG: NSR with poor R wave progression and non specific ST -T wave changes.   Stress Procedure:  The patient received IV Lexiscan 0.4 mg over 15-seconds with  concurrent low level exercise and then Technetium 461m Sestamibi was injected at 30-seconds while the patient continued walking one more minute.  Quantitative spect images were obtained after a 45-minute delay.  During the infusion of Lexiscan, the patient complained of fatigue.  This resolved in recovery.  Stress ECG: No significant change from baseline ECG  QPS Raw Data Images:  Normal; no motion artifact; normal heart/lung ratio. Stress Images:  Normal homogeneous uptake in all areas of the myocardium. Rest Images:  Normal homogeneous uptake in all areas of the myocardium. Subtraction (SDS):  No evidence of ischemia. Transient Ischemic Dilatation (Normal <1.22):  0.77 Lung/Heart Ratio (Normal <0.45):  0.45  Quantitative Gated Spect Images QGS EDV:  64 ml QGS ESV:  22 ml  Impression Exercise Capacity:  Lexiscan with low level exercise. BP Response:  Normal blood pressure response. Clinical Symptoms:  Fatigue.  ECG Impression:  No significant ST segment change suggestive of ischemia. Comparison with Prior Nuclear Study: No images to compare  Overall Impression:  Low risk stress nuclear study with no ischemia. .  LV Ejection Fraction: 66%.  LV Wall Motion:  NL LV Function; NL Wall Motion  Donato SchultzSKAINS, Gwynevere Lizana, MD

## 2014-10-19 ENCOUNTER — Other Ambulatory Visit: Payer: Self-pay

## 2014-10-19 DIAGNOSIS — Z1231 Encounter for screening mammogram for malignant neoplasm of breast: Secondary | ICD-10-CM

## 2014-10-21 ENCOUNTER — Ambulatory Visit
Admission: RE | Admit: 2014-10-21 | Discharge: 2014-10-21 | Disposition: A | Discharge status: Home / Self Care | Payer: BC Managed Care – PPO | Source: Ambulatory Visit

## 2014-10-21 DIAGNOSIS — Z1231 Encounter for screening mammogram for malignant neoplasm of breast: Secondary | ICD-10-CM

## 2014-11-02 ENCOUNTER — Ambulatory Visit (INDEPENDENT_AMBULATORY_CARE_PROVIDER_SITE_OTHER): Payer: BC Managed Care – PPO | Admitting: Physician Assistant

## 2014-11-02 ENCOUNTER — Other Ambulatory Visit: Payer: Self-pay | Admitting: Physician Assistant

## 2014-11-02 ENCOUNTER — Other Ambulatory Visit: Payer: Self-pay | Admitting: *Deleted

## 2014-11-02 ENCOUNTER — Encounter: Payer: Self-pay | Admitting: Physician Assistant

## 2014-11-02 ENCOUNTER — Encounter: Payer: Self-pay | Admitting: *Deleted

## 2014-11-02 DIAGNOSIS — R079 Chest pain, unspecified: Secondary | ICD-10-CM

## 2014-11-02 DIAGNOSIS — Z8249 Family history of ischemic heart disease and other diseases of the circulatory system: Secondary | ICD-10-CM | POA: Insufficient documentation

## 2014-11-02 LAB — COMPREHENSIVE METABOLIC PANEL
ALT: 14 U/L (ref 0–35)
AST: 14 U/L (ref 0–37)
Albumin: 3.2 g/dL — ABNORMAL LOW (ref 3.5–5.2)
Alkaline Phosphatase: 93 U/L (ref 39–117)
BUN: 16 mg/dL (ref 6–23)
CALCIUM: 9.4 mg/dL (ref 8.4–10.5)
CO2: 26 meq/L (ref 19–32)
Chloride: 103 mEq/L (ref 96–112)
Creatinine, Ser: 0.6 mg/dL (ref 0.4–1.2)
GFR: 134.62 mL/min (ref >60.00)
Glucose, Bld: 94 mg/dL (ref 70–99)
POTASSIUM: 3.8 meq/L (ref 3.5–5.1)
SODIUM: 140 meq/L (ref 135–145)
TOTAL PROTEIN: 6.9 g/dL (ref 6.0–8.3)
Total Bilirubin: 0.2 mg/dL (ref 0.2–1.2)

## 2014-11-02 LAB — CBC
HEMATOCRIT: 38.2 % (ref 36.0–46.0)
Hemoglobin: 12 g/dL (ref 12.0–15.0)
MCHC: 31.3 g/dL (ref 30.0–36.0)
MCV: 88 fl (ref 78.0–100.0)
Platelets: 296 10*3/uL (ref 150.0–400.0)
RBC: 4.34 Mil/uL (ref 3.87–5.11)
RDW: 16 % — AB (ref 11.5–15.5)
WBC: 11.9 10*3/uL — AB (ref 4.0–10.5)

## 2014-11-02 LAB — PROTIME-INR
INR: 1 ratio (ref 0.8–1.0)
Prothrombin Time: 11.5 s (ref 9.6–13.1)

## 2014-11-02 LAB — APTT: aPTT: 31.7 s (ref 23.4–32.7)

## 2014-11-02 NOTE — Patient Instructions (Addendum)
Your physician recommends that you continue on your current medications as directed. Please refer to the Current Medication list given to you today.   Your physician has requested that you have a cardiac catheterization. Cardiac catheterization is used to diagnose and/or treat various heart conditions. Doctors may recommend this procedure for a number of different reasons. The most common reason is to evaluate chest pain. Chest pain can be a symptom of coronary artery disease (CAD), and cardiac catheterization can show whether plaque is narrowing or blocking your heart's arteries. This procedure is also used to evaluate the valves, as well as measure the blood flow and oxygen levels in different parts of your heart. For further information please visit [email or web address] Please follow instruction sheet, as given.  Your physician recommends that you return for lab work TODAY CMET CBC PT PTT   YOUR ARE SCHEDULED FOR A LEFT HEART CARDIAC CATH ON  11/05/14.1030 WITH DR Eldridge DaceVARANASI  YOU WILL NEED TO COME  TO THE [facility]  @ 8:30 AM   MAKE SURE YOU TAKE YOUR MEDICINE NORMAL SCHEDULE IN AM WITH WATER    May have clear liquid breakfast, then nothing to eat or drink after MIDNIGHT   BRING A CHANGE OF CLOTHING AND HAVE SOMEONE TO COME PICK YOU UP AFTER YOUR PROCEDURE

## 2014-11-02 NOTE — Assessment & Plan Note (Signed)
With family history of her early CAD, we'll proceed with cardiac catheterization.

## 2014-11-02 NOTE — Progress Notes (Signed)
ZOX:WRUEHPI:This is a 58 year old female patient referred to us by Dr. James Ivanoffammi Fulp because of recent upper chest pain and abnormal EKG. She also has history of GERD and fatigue.EKG on 10/19/14 showed normal sinus rhythm with poor R wave progression and nonspecific ST-T wave changes.she last saw Dr. Eldridge DaceVaranasi 03/04/14 for atypical chest pain. Because of her strong family history of CAD with both her sister and mother dying suddenly of MIs at young ages he ordered a Cardiolite stress test.this was a low risk nuclear study with no ischemia EF 66% with normal LV function.EKG from March 2015 is similar to EKG on 10/19/14.  Patient comes in today complaining of the same chest pain that she's been having over the past year. She says she gets a pressure in her chest and sometimes radiates down her right arm associated with shortness of breath. It occurs at rest and lasts a few minutes and eases spontaneously. She denies any exertional symptoms. She works as a Advertising copywriterhousekeeper. She has no history of hypertension, diabetes mellitus, hyperlipidemia or smoking history. Her mother died at 2763 of an MI in her sister died at 4450 of an MI.  Allergies  Allergen Reactions  . Penicillins Hives and Swelling    Throat swelling  . Nabumetone     HA, feet tingles     Current Outpatient Prescriptions  Medication Sig Dispense Refill  . diclofenac (VOLTAREN) 75 MG EC tablet Take 75 mg by mouth 2 (two) times daily.    Marland Kitchen. ibuprofen (ADVIL,MOTRIN) 200 MG tablet Take 200 mg by mouth every 6 (six) hours as needed.    Marland Kitchen. PYLERA 140-125-125 MG per capsule Take 3 capsules by mouth 4 (four) times daily -  before meals and at bedtime.     . ranitidine (ZANTAC) 150 MG tablet Take 150 mg by mouth 2 (two) times daily.     Marland Kitchen. sulfamethoxazole-trimethoprim (BACTRIM DS) 800-160 MG per tablet Take 1 tablet by mouth 2 (two) times daily.     . Vitamin D, Ergocalciferol, (DRISDOL) 50000 UNITS CAPS capsule Take 50,000 Units by mouth every 7 (seven)  days.      No current facility-administered medications for this visit.    Past Medical History  Diagnosis Date  . Anxiety   . Arthritis     right knee  . GERD (gastroesophageal reflux disease)     Past Surgical History  Procedure Laterality Date  . Cholecystectomy  1970  . Carpal tunnel release  2008  . Knee arthroscopy  2002    right knee  . Tubal ligation    . Gallstone removal      Family History  Problem Relation Age of Onset  . Heart disease Mother 2863    MI  . Cancer Mother 5350    breast ca  . Deep vein thrombosis Father   . Heart disease Sister 6640    MI    History   Social History  . Marital Status: Single    Spouse Name: N/A    Number of Children: N/A  . 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 Smoking status: Never Smoker   . Smokeless tobacco: Not on file  . Alcohol Use: No  . Drug Use: No  . Sexual Activity: Not on file   Other Topics Concern  . Not on file   Social History Narrative   Lives with husband.  2 children- grown.  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 Neck: No JVD, HJR, Bruit, or thyroid enlargement  Lungs: No tachypnea, clear without wheezing, rales, or rhonchi  Cardiovascular: RRR, PMI not displaced, Normal S1 and S2, no murmurs, gallops, bruit, thrill, or heave.  Abdomen: BS normal. Soft without organomegaly, masses, lesions or tenderness.  Extremities: without cyanosis, clubbing or edema. Good distal pulses bilateral  SKin: Warm, no lesions or rashes   Musculoskeletal: No deformities  Neuro: no focal signs   Wt Readings from Last 3 Encounters:  03/17/14 160 lb (72.576 kg)  03/04/14 161 lb 12.8 oz (73.392 kg)  02/13/12 142 lb (64.411 kg)    Lab Results    Component Value Date   WBC 6.5 01/25/2012   HGB 12.0 01/25/2012   HCT 38.1 01/25/2012   PLT 258 01/25/2012   GLUCOSE 85 01/25/2012   CHOL 200 01/25/2012   TRIG 44 01/25/2012   HDL 74 01/25/2012   LDLCALC 117* 01/25/2012   ALT 9 01/25/2012   AST 14 01/25/2012   NA 139 01/25/2012   K 4.0 01/25/2012   CL 103 01/25/2012   CREATININE 0.63 01/25/2012   BUN 16 01/25/2012   CO2 27 01/25/2012   TSH 0.482 01/25/2012    JYN:WGNFAOEKG:normal sinus rhythm poor R wave progression anteriorly, nonspecific ST-T wave changes, no acute change from prior tracings.

## 2014-11-02 NOTE — H&P (Signed)
ZOX:WRUEHPI:This is a 58 year old female patient referred to us by Dr. James Ivanoffammi Fulp because of recent upper chest pain and abnormal EKG. She also has history of GERD and fatigue.EKG on 10/19/14 showed normal sinus rhythm with poor R wave progression and nonspecific ST-T wave changes.she last saw Dr. Eldridge DaceVaranasi 03/04/14 for atypical chest pain. Because of her strong family history of CAD with both her sister and mother dying suddenly of MIs at young ages he ordered a Cardiolite stress test.this was a low risk nuclear study with no ischemia EF 66% with normal LV function.EKG from March 2015 is similar to EKG on 10/19/14.  Patient comes in today complaining of the same chest pain that she's been having over the past year. She says she gets a pressure in her chest and sometimes radiates down her right arm associated with shortness of breath. It occurs at rest and lasts a few minutes and eases spontaneously. She denies any exertional symptoms. She works as a Advertising copywriterhousekeeper. She has no history of hypertension, diabetes mellitus, hyperlipidemia or smoking history. Her mother died at 3063 of an MI in her sister died at 4150 of an MI.  Allergies  Allergen Reactions  . Penicillins Hives and Swelling    Throat swelling  . Nabumetone     HA, feet tingles     Current Outpatient Prescriptions  Medication Sig Dispense Refill  . diclofenac (VOLTAREN) 75 MG EC tablet Take 75 mg by mouth 2 (two) times daily.    Marland Kitchen. ibuprofen (ADVIL,MOTRIN) 200 MG tablet Take 200 mg by mouth every 6 (six) hours as needed.    Marland Kitchen. PYLERA 140-125-125 MG per capsule Take 3 capsules by mouth 4 (four) times daily - before meals and at bedtime.     . ranitidine (ZANTAC) 150 MG tablet Take 150 mg by mouth 2 (two) times daily.     Marland Kitchen. sulfamethoxazole-trimethoprim (BACTRIM DS) 800-160 MG per tablet Take 1 tablet by mouth 2 (two) times daily.     . Vitamin D, Ergocalciferol, (DRISDOL) 50000 UNITS CAPS capsule Take  50,000 Units by mouth every 7 (seven) days.      No current facility-administered medications for this visit.    Past Medical History  Diagnosis Date  . Anxiety   . Arthritis     right knee  . GERD (gastroesophageal reflux disease)     Past Surgical History  Procedure Laterality Date  . Cholecystectomy  1970  . Carpal tunnel release  2008  . Knee arthroscopy  2002    right knee  . Tubal ligation    . Gallstone removal      Family History  Problem Relation Age of Onset  . Heart disease Mother 7663    MI  . Cancer Mother 3350    breast ca  . Deep vein thrombosis Father   . Heart disease Sister 4340    MI    History   Social History  . Marital Status: Single    Spouse Name: N/A    Number of Children: N/A  . 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 Smoking status: Never Smoker   . Smokeless tobacco: Not on file  . Alcohol Use: No  . Drug Use: No  . Sexual Activity: Not on file   Other Topics Concern  . Not on file   Social History Narrative   Lives with husband. 2 children- grown. 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 Neck: No JVD, HJR, Bruit, or thyroid enlargement  Lungs: No tachypnea, clear without wheezing, rales, or rhonchi  Cardiovascular: RRR, PMI not displaced, Normal S1 and S2, no murmurs, gallops, bruit, thrill, or heave.  Abdomen: BS normal. Soft without organomegaly, masses, lesions or tenderness.  Extremities: without cyanosis, clubbing or edema. Good distal pulses bilateral  SKin: Warm, no lesions or rashes   Musculoskeletal: No deformities  Neuro: no  focal signs   Wt Readings from Last 3 Encounters:  03/17/14 160 lb (72.576 kg)  03/04/14 161 lb 12.8 oz (73.392 kg)  02/13/12 142 lb (64.411 kg)     Recent Labs    Lab Results  Component Value Date   WBC 6.5 01/25/2012   HGB 12.0 01/25/2012   HCT 38.1 01/25/2012   PLT 258 01/25/2012   GLUCOSE 85 01/25/2012   CHOL 200 01/25/2012   TRIG 44 01/25/2012   HDL 74 01/25/2012   LDLCALC 117* 01/25/2012   ALT 9 01/25/2012   AST 14 01/25/2012   NA 139 01/25/2012   K 4.0 01/25/2012   CL 103 01/25/2012   CREATININE 0.63 01/25/2012   BUN 16 01/25/2012   CO2 27 01/25/2012   TSH 0.482 01/25/2012      EKG:normal sinus rhythm poor R wave progression anteriorly, nonspecific ST-T wave changes, no acute change from prior tracings.             Chest pain - Manal Kreutzer M Faelynn Wynder, PA-C at 11/02/2014 11:29 AM     Status: Written Related Problem: Chest pain   Expand All Collapse All   Patient continues to have chest pressure associated with shortness of breath and right arm pain off-and-on at rest. She had a normal Lexi scan Myoview in March 2015. She does have a strong family history of CAD but no other cardiac risk factors. She could have spasm. I discussed this patient with Dr.Varanasi who recommends cardiac catheterization. I explained the risk and benefits of cardiac catheterization with the patient including bleeding, MI, stroke and death. Patient is agreeable to proceed.            Family history of early CAD - Morayo Leven M Jeanine Caven, PA-C at 11/02/2014 11:31 AM     Status: Written Related Problem: Family history of early CAD   Expand All Collapse All   With family history of her early CAD, we'll proceed with cardiac catheterization.            

## 2014-11-02 NOTE — Assessment & Plan Note (Signed)
Patient continues to have chest pressure associated with shortness of breath and right arm pain off-and-on at rest. She had a normal Lexi scan Myoview in March 2015. She does have a strong family history of CAD but no other cardiac risk factors. She could have spasm. I discussed this patient with Dr.Varanasi who recommends cardiac catheterization. I explained the risk and benefits of cardiac catheterization with the patient including bleeding, MI, stroke and death. Patient is agreeable to proceed.

## 2014-11-03 ENCOUNTER — Telehealth: Payer: Self-pay | Admitting: Interventional Cardiology

## 2014-11-03 NOTE — Progress Notes (Signed)
lmtcb

## 2014-11-03 NOTE — Telephone Encounter (Signed)
New problem   Pt want to cancel her procedure due to lack of time. Please call pt is any questions.

## 2014-11-03 NOTE — Telephone Encounter (Signed)
I spoke with Clydie BraunKaren at the cath lab and cancelled the patient's procedure. Will forward to Dr. Eldridge DaceVaranasi and Herma CarsonMichelle Lenze, PA as an [place].

## 2014-11-03 NOTE — Telephone Encounter (Signed)
Patient is scheduled for a cardiac cath on 11/12 with Dr. Eldridge DaceVaranasi- I attempted to call the patient back. I left a voice message stating I would cancel her procedure for 11/05/14 and for her to call back if she would like to reschedule. Sherri RadHeather Daryle Amis, RN, BSN  I attempted to call the cath lab- busy x 2 tries. I will call back to cancel. Sherri RadHeather Sherwin Hollingshed, RN, BSN

## 2014-11-05 ENCOUNTER — Ambulatory Visit (HOSPITAL_COMMUNITY)
Admission: RE | Admit: 2014-11-05 | Payer: BC Managed Care – PPO | Source: Ambulatory Visit | Admitting: Interventional Cardiology

## 2014-11-05 ENCOUNTER — Encounter (HOSPITAL_COMMUNITY): Admission: RE | Payer: Self-pay | Source: Ambulatory Visit

## 2014-11-05 SURGERY — LEFT HEART CATHETERIZATION WITH CORONARY ANGIOGRAM
Anesthesia: LOCAL

## 2014-11-09 ENCOUNTER — Telehealth: Payer: Self-pay | Admitting: Interventional Cardiology

## 2014-11-09 NOTE — Telephone Encounter (Signed)
New message          Pt would like to know the date of her rescheduled procedure

## 2014-11-09 NOTE — Telephone Encounter (Signed)
I spoke with Dr. Eldridge DaceVaranasi- he could potentially cath the patient Thursday/ Friday this week ~ 10:30 am. I called the patient and she is ok with either day. I advised I will call the cath lab in the morning to schedule and call her back.

## 2014-11-09 NOTE — Telephone Encounter (Signed)
I spoke with the patient and advised her that her cath has not been rescheduled as this was not mentioned when she called to cancel this in the first place. I explained to her that Dr. Eldridge DaceVaranasi is not in the cath lab this week, but I will discuss with him to see if there is a time he would cath her this week, otherwise, will schedule for 11/23 with Dr. Eldridge DaceVaranasi. I will call her back after reviewing Dr. Eldridge DaceVaranasi schedule with him this week. She is agreeable.

## 2014-11-10 ENCOUNTER — Encounter: Payer: Self-pay | Admitting: *Deleted

## 2014-11-10 NOTE — Telephone Encounter (Signed)
Left heart cath scheduled for 11/13/14 at 10:30 am with Dr. Eldridge DaceVaranasi. Verbal instructions have been given to the patient and she verbalizes understanding. Will forward to Dr. Eldridge DaceVaranasi to complete orders.

## 2014-11-12 ENCOUNTER — Other Ambulatory Visit: Payer: Self-pay | Admitting: Interventional Cardiology

## 2014-11-12 DIAGNOSIS — I209 Angina pectoris, unspecified: Secondary | ICD-10-CM

## 2014-11-13 ENCOUNTER — Encounter (HOSPITAL_COMMUNITY)
Admission: RE | Disposition: A | Discharge status: Home / Self Care | Payer: Self-pay | Source: Ambulatory Visit | Attending: Interventional Cardiology

## 2014-11-13 ENCOUNTER — Ambulatory Visit (HOSPITAL_COMMUNITY)
Admission: RE | Admit: 2014-11-13 | Discharge: 2014-11-13 | Disposition: A | Discharge status: Home / Self Care | Payer: BC Managed Care – PPO | Source: Ambulatory Visit | Attending: Interventional Cardiology | Admitting: Interventional Cardiology

## 2014-11-13 DIAGNOSIS — R0789 Other chest pain: Secondary | ICD-10-CM | POA: Insufficient documentation

## 2014-11-13 DIAGNOSIS — K219 Gastro-esophageal reflux disease without esophagitis: Secondary | ICD-10-CM | POA: Insufficient documentation

## 2014-11-13 DIAGNOSIS — Z791 Long term (current) use of non-steroidal anti-inflammatories (NSAID): Secondary | ICD-10-CM | POA: Diagnosis not present

## 2014-11-13 DIAGNOSIS — Z8249 Family history of ischemic heart disease and other diseases of the circulatory system: Secondary | ICD-10-CM | POA: Diagnosis not present

## 2014-11-13 DIAGNOSIS — M1711 Unilateral primary osteoarthritis, right knee: Secondary | ICD-10-CM | POA: Diagnosis not present

## 2014-11-13 DIAGNOSIS — Z79899 Other long term (current) drug therapy: Secondary | ICD-10-CM | POA: Insufficient documentation

## 2014-11-13 DIAGNOSIS — R079 Chest pain, unspecified: Secondary | ICD-10-CM

## 2014-11-13 DIAGNOSIS — F419 Anxiety disorder, unspecified: Secondary | ICD-10-CM | POA: Diagnosis not present

## 2014-11-13 DIAGNOSIS — R9431 Abnormal electrocardiogram [ECG] [EKG]: Secondary | ICD-10-CM | POA: Insufficient documentation

## 2014-11-13 DIAGNOSIS — I209 Angina pectoris, unspecified: Secondary | ICD-10-CM

## 2014-11-13 HISTORY — PX: LEFT HEART CATHETERIZATION WITH CORONARY ANGIOGRAM: SHX5451

## 2014-11-13 SURGERY — LEFT HEART CATHETERIZATION WITH CORONARY ANGIOGRAM
Anesthesia: LOCAL

## 2014-11-13 MED ORDER — SODIUM CHLORIDE 0.9 % IV SOLN: 1.0000 mL/kg/h | INTRAVENOUS | Status: DC

## 2014-11-13 MED ORDER — MIDAZOLAM HCL 2 MG/2ML IJ SOLN
INTRAMUSCULAR | Status: AC
  Filled 2014-11-13: qty 2

## 2014-11-13 MED ORDER — VERAPAMIL HCL 2.5 MG/ML IV SOLN
INTRAVENOUS | Status: AC
  Filled 2014-11-13: qty 2

## 2014-11-13 MED ORDER — HEPARIN (PORCINE) IN NACL 2-0.9 UNIT/ML-% IJ SOLN
INTRAMUSCULAR | Status: AC
  Filled 2014-11-13: qty 1000

## 2014-11-13 MED ORDER — NITROGLYCERIN 1 MG/10 ML FOR IR/CATH LAB
INTRA_ARTERIAL | Status: AC
  Filled 2014-11-13: qty 10

## 2014-11-13 MED ORDER — SODIUM CHLORIDE 0.9 % IJ SOLN: 3.0000 mL | INTRAMUSCULAR | Status: DC | PRN

## 2014-11-13 MED ORDER — FENTANYL CITRATE 0.05 MG/ML IJ SOLN
INTRAMUSCULAR | Status: AC
  Filled 2014-11-13: qty 2

## 2014-11-13 MED ORDER — SODIUM CHLORIDE 0.9 % IV SOLN: 250.0000 mL | INTRAVENOUS | Status: DC | PRN

## 2014-11-13 MED ORDER — SODIUM CHLORIDE 0.9 % IJ SOLN: 3.0000 mL | Freq: Two times a day (BID) | INTRAMUSCULAR | Status: DC

## 2014-11-13 MED ORDER — HEPARIN SODIUM (PORCINE) 1000 UNIT/ML IJ SOLN
INTRAMUSCULAR | Status: AC
  Filled 2014-11-13: qty 1

## 2014-11-13 MED ORDER — ASPIRIN 81 MG PO CHEW
CHEWABLE_TABLET | ORAL | Status: AC
  Filled 2014-11-13: qty 1

## 2014-11-13 MED ORDER — ASPIRIN 81 MG PO CHEW
81.0000 mg | CHEWABLE_TABLET | ORAL | Status: AC
  Administered 2014-11-13: 81 mg via ORAL

## 2014-11-13 MED ORDER — LIDOCAINE HCL (PF) 1 % IJ SOLN
INTRAMUSCULAR | Status: AC
  Filled 2014-11-13: qty 30

## 2014-11-13 MED ORDER — SODIUM CHLORIDE 0.9 % IV SOLN
INTRAVENOUS | Status: DC
  Administered 2014-11-13: 09:00:00 via INTRAVENOUS

## 2014-11-13 NOTE — Progress Notes (Signed)
AUC  Ischemic Symptoms? CCS III (Marked limitation of ordinary activity) Anti-ischemic Medical Therapy? No Therapy Non-invasive Test Results? Low-risk stress test findings: cardiac mortality <1%/year Prior CABG? No Previous CABG   Patient Information:   1-2V CAD, no prox LAD  U (5)  Indication: 14; Score: 5   Patient Information:   CTO of 1 vessel, no other CAD  I (3)  Indication: 24; Score: 3   Patient Information:   1V CAD with prox LAD  A (7)  Indication: 30; Score: 7   Patient Information:   2V-CAD with prox LAD  A (7)  Indication: 36; Score: 7   Patient Information:   3V-CAD without LMCA  A (7)  Indication: 42; Score: 7   Patient Information:   3V-CAD without LMCA With Abnormal LV systolic function  A (9)  Indication: 48; Score: 9   Patient Information:   LMCA-CAD  A (9)  Indication: 49; Score: 9   Patient Information:   2V-CAD with prox LAD PCI  A (7)  Indication: 62; Score: 7   Patient Information:   2V-CAD with prox LAD CABG  A (8)  Indication: 62; Score: 8   Patient Information:   3V-CAD without LMCA With Low CAD burden(i.e., 3 focal stenoses, low SYNTAX score) PCI  A (7)  Indication: 63; Score: 7   Patient Information:   3V-CAD without LMCA With Low CAD burden(i.e., 3 focal stenoses, low SYNTAX score) CABG  A (9)  Indication: 63; Score: 9   Patient Information:   3V-CAD without LMCA E06c - Intermediate-high CAD burden (i.e., multiple diffuse lesions, presence of CTO, or high SYNTAX score) PCI  U (4)  Indication: 64; Score: 4   Patient Information:   3V-CAD without LMCA E06c - Intermediate-high CAD burden (i.e., multiple diffuse lesions, presence of CTO, or high SYNTAX score) CABG  A (9)  Indication: 64; Score: 9   Patient Information:   LMCA-CAD With Isolated LMCA stenosis  PCI  U (6)  Indication: 65; Score: 6   Patient Information:   LMCA-CAD With Isolated LMCA stenosis  CABG  A (9)   Indication: 65; Score: 9   Patient Information:   LMCA-CAD Additional CAD, low CAD burden (i.e., 1- to 2-vessel additional involvement, low SYNTAX score) PCI  U (5)  Indication: 66; Score: 5   Patient Information:   LMCA-CAD Additional CAD, low CAD burden (i.e., 1- to 2-vessel additional involvement, low SYNTAX score) CABG  A (9)  Indication: 66; Score: 9   Patient Information:   LMCA-CAD Additional CAD, intermediate-high CAD burden (i.e., 3-vessel involvement, presence of CTO, or high SYNTAX score) PCI  I (3)  Indication: 67; Score: 3   Patient Information:   LMCA-CAD Additional CAD, intermediate-high CAD burden (i.e., 3-vessel involvement, presence of CTO, or high SYNTAX score) CABG  A (9)  Indication: 67; Score: 9

## 2014-11-13 NOTE — H&P (View-Only) (Signed)
ZOX:WRUEHPI:This is a 58 year old female patient referred to us by Dr. James Ivanoffammi Fulp because of recent upper chest pain and abnormal EKG. She also has history of GERD and fatigue.EKG on 10/19/14 showed normal sinus rhythm with poor R wave progression and nonspecific ST-T wave changes.she last saw Dr. Eldridge DaceVaranasi 03/04/14 for atypical chest pain. Because of her strong family history of CAD with both her sister and mother dying suddenly of MIs at young ages he ordered a Cardiolite stress test.this was a low risk nuclear study with no ischemia EF 66% with normal LV function.EKG from March 2015 is similar to EKG on 10/19/14.  Patient comes in today complaining of the same chest pain that she's been having over the past year. She says she gets a pressure in her chest and sometimes radiates down her right arm associated with shortness of breath. It occurs at rest and lasts a few minutes and eases spontaneously. She denies any exertional symptoms. She works as a Advertising copywriterhousekeeper. She has no history of hypertension, diabetes mellitus, hyperlipidemia or smoking history. Her mother died at 3063 of an MI in her sister died at 4150 of an MI.  Allergies  Allergen Reactions  . Penicillins Hives and Swelling    Throat swelling  . Nabumetone     HA, feet tingles     Current Outpatient Prescriptions  Medication Sig Dispense Refill  . diclofenac (VOLTAREN) 75 MG EC tablet Take 75 mg by mouth 2 (two) times daily.    Marland Kitchen. ibuprofen (ADVIL,MOTRIN) 200 MG tablet Take 200 mg by mouth every 6 (six) hours as needed.    Marland Kitchen. PYLERA 140-125-125 MG per capsule Take 3 capsules by mouth 4 (four) times daily - before meals and at bedtime.     . ranitidine (ZANTAC) 150 MG tablet Take 150 mg by mouth 2 (two) times daily.     Marland Kitchen. sulfamethoxazole-trimethoprim (BACTRIM DS) 800-160 MG per tablet Take 1 tablet by mouth 2 (two) times daily.     . Vitamin D, Ergocalciferol, (DRISDOL) 50000 UNITS CAPS capsule Take  50,000 Units by mouth every 7 (seven) days.      No current facility-administered medications for this visit.    Past Medical History  Diagnosis Date  . Anxiety   . Arthritis     right knee  . GERD (gastroesophageal reflux disease)     Past Surgical History  Procedure Laterality Date  . Cholecystectomy  1970  . Carpal tunnel release  2008  . Knee arthroscopy  2002    right knee  . Tubal ligation    . Gallstone removal      Family History  Problem Relation Age of Onset  . Heart disease Mother [id number]    MI  . Cancer Mother 3350    breast ca  . Deep vein thrombosis Father   . Heart disease Sister 4340    MI    History   Social History  . Marital Status: Single    Spouse Name: N/A    Number of Children: N/A  . 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 Smoking status: Never Smoker   . Smokeless tobacco: Not on file  . Alcohol Use: No  . Drug Use: No  . Sexual Activity: Not on file   Other Topics Concern  . Not on file   Social History Narrative   Lives with husband. 2 children- grown. 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 Neck: No JVD, HJR, Bruit, or thyroid enlargement  Lungs: No tachypnea, clear without wheezing, rales, or rhonchi  Cardiovascular: RRR, PMI not displaced, Normal S1 and S2, no murmurs, gallops, bruit, thrill, or heave.  Abdomen: BS normal. Soft without organomegaly, masses, lesions or tenderness.  Extremities: without cyanosis, clubbing or edema. Good distal pulses bilateral  SKin: Warm, no lesions or rashes   Musculoskeletal: No deformities  Neuro: no  focal signs   Wt Readings from Last 3 Encounters:  03/17/14 160 lb (72.576 kg)  03/04/14 161 lb 12.8 oz (73.392 kg)  02/13/12 142 lb (64.411 kg)     Recent Labs    Lab Results  Component Value Date   WBC 6.5 01/25/2012   HGB 12.0 01/25/2012   HCT 38.1 01/25/2012   PLT 258 01/25/2012   GLUCOSE 85 01/25/2012   CHOL 200 01/25/2012   TRIG 44 01/25/2012   HDL 74 01/25/2012   LDLCALC 117* 01/25/2012   ALT 9 01/25/2012   AST 14 01/25/2012   NA 139 01/25/2012   K 4.0 01/25/2012   CL 103 01/25/2012   CREATININE 0.63 01/25/2012   BUN 16 01/25/2012   CO2 27 01/25/2012   TSH 0.482 01/25/2012      ZOX:WRUEAVEKG:normal sinus rhythm poor R wave progression anteriorly, nonspecific ST-T wave changes, no acute change from prior tracings.             Chest pain - Dyann KiefMichele M Nicole Defino, PA-C at 11/02/2014 11:29 AM     Status: Written Related Problem: Chest pain   Expand All Collapse All   Patient continues to have chest pressure associated with shortness of breath and right arm pain off-and-on at rest. She had a normal Lexi scan Myoview in March 2015. She does have a strong family history of CAD but no other cardiac risk factors. She could have spasm. I discussed this patient with Dr.Varanasi who recommends cardiac catheterization. I explained the risk and benefits of cardiac catheterization with the patient including bleeding, MI, stroke and death. Patient is agreeable to proceed.            Family history of early CAD - Dyann KiefMichele M Lauria Depoy, PA-C at 11/02/2014 11:31 AM     Status: Written Related Problem: Family history of early CAD   Expand All Collapse All   With family history of her early CAD, we'll proceed with cardiac catheterization.

## 2014-11-13 NOTE — CV Procedure (Signed)
       PROCEDURE:  Left heart catheterization with selective coronary angiography, left ventriculogram.  INDICATIONS:  Persistent chest pain  The risks, benefits, and details of the procedure were explained to the patient.  The patient verbalized understanding and wanted to proceed.  Informed written consent was obtained.  PROCEDURE TECHNIQUE:  After Xylocaine anesthesia a 6F slender sheath was placed in the right radial artery with a single anterior needle wall stick.   IV Heparin was given.  Right coronary angiography was done using a Judkins R4 guide catheter.  Left coronary angiography was done using a Judkins L3.5 guide catheter.  Left ventriculography was done using a pigtail catheter.  A TR band was used for hemostasis.   CONTRAST:  Total of 55 cc.  COMPLICATIONS:  None.    HEMODYNAMICS:  Aortic pressure was 126/70; LV pressure was 131/3; LVEDP 8.  There was no gradient between the left ventricle and aorta.    ANGIOGRAPHIC DATA:   The left main coronary artery is angiographically normal.  The left anterior descending artery is angiographically normal.  There is a large first diagonal which is normal.  The left circumflex artery is angiographically normal.  There is a large OM1 which is patent.  The OM2 is patent.  The right coronary artery is angiographically normal.  The PDA is medium sized and patent.  The PL artery is small.  LEFT VENTRICULOGRAM:  Left ventricular angiogram was done in the 30 RAO projection and revealed normal left ventricular wall motion and systolic function with an estimated ejection fraction of 60%.  LVEDP was 8 mmHg.  Ascending aorta appears normal.  IMPRESSIONS:  1. Normal left main coronary artery. 2. Normal left anterior descending artery and its branches. 3. Normal left circumflex artery and its branches. 4. Normal right coronary artery. 5. Normal left ventricular systolic function.  LVEDP 8 mmHg.  Ejection fraction 60%.  RECOMMENDATION:   Continue aggressive preventive therapy.  Her chest pain is not cardiac in nature.

## 2014-11-13 NOTE — Interval H&P Note (Signed)
Cath Lab Visit (complete for each Cath Lab visit)  Clinical Evaluation Leading to the Procedure:   ACS: No.  Non-ACS:    Anginal Classification: CCS III  Anti-ischemic medical therapy: No Therapy  Non-Invasive Test Results: Low-risk stress test findings: cardiac mortality <1%/year  Prior CABG: No previous CABG  Patient now with sx on minimal exertion and sometimes at rest.  Significant family h/o CAD.    History and Physical Interval Note:  11/13/2014 12:46 PM  Samantha Benton  has presented today for surgery, with the diagnosis of cp  The various methods of treatment have been discussed with the patient and family. After consideration of risks, benefits and other options for treatment, the patient has consented to  Procedure(s): LEFT HEART CATHETERIZATION WITH CORONARY ANGIOGRAM (N/A) as a surgical intervention .  The patient's history has been reviewed, patient examined, no change in status, stable for surgery.  I have reviewed the patient's chart and labs.  Questions were answered to the patient's satisfaction.     Zeke Aker S.

## 2014-11-13 NOTE — Discharge Instructions (Signed)
Radial Site Care °Refer to this sheet in the next few weeks. These instructions provide you with information on caring for yourself after your procedure. Your caregiver may also give you more specific instructions. Your treatment has been planned according to current medical practices, but problems sometimes occur. Call your caregiver if you have any problems or questions after your procedure. °HOME CARE INSTRUCTIONS °· You may shower the day after the procedure. Remove the bandage (dressing) and gently wash the site with plain soap and water. Gently pat the site dry. °· Do not apply powder or lotion to the site. °· Do not submerge the affected site in water for 3 to 5 days. °· Inspect the site at least twice daily. °· Do not flex or bend the affected arm for 24 hours. °· No lifting over 5 pounds (2.3 kg) for 5 days after your procedure. °· Do not drive home if you are discharged the same day of the procedure. Have someone else drive you. °· You may drive 24 hours after the procedure unless otherwise instructed by your caregiver. °· Do not operate machinery or power tools for 24 hours. °· A responsible adult should be with you for the first 24 hours after you arrive home. °What to expect: °· Any bruising will usually fade within 1 to 2 weeks. °· Blood that collects in the tissue (hematoma) may be painful to the touch. It should usually decrease in size and tenderness within 1 to 2 weeks. °SEEK IMMEDIATE MEDICAL CARE IF: °· You have unusual pain at the radial site. °· You have redness, warmth, swelling, or pain at the radial site. °· You have drainage (other than a small amount of blood on the dressing). °· You have chills. °· You have a fever or persistent symptoms for more than 72 hours. °· You have a fever and your symptoms suddenly get worse. °· Your arm becomes pale, cool, tingly, or numb. °· You have heavy bleeding from the site. Hold pressure on the site. CALL 911 °Document Released: 01/13/2011 Document  Revised: 03/04/2012 Document Reviewed: 01/13/2011 °ExitCare® Patient Information ©2015 [employer]. This information is not intended to replace advice given to you by your health care provider. Make sure you discuss any questions you have with your health care provider. ° °

## 2014-11-17 ENCOUNTER — Ambulatory Visit: Payer: Self-pay | Admitting: Orthopedic Surgery

## 2014-11-17 NOTE — Progress Notes (Signed)
Preoperative surgical orders have been place into the Epic hospital system for Samantha Benton on 11/17/2014, 3:35 PM  by Patrica DuelPERKINS, ALEXZANDREW for surgery on 12-09-2014.  Preop Knee Scope orders including IV Tylenol and IV Decadron as long as there are no contraindications to the above medications. Samantha Peacerew Perkins, PA-C

## 2014-12-02 NOTE — Patient Instructions (Addendum)
Samantha Benton  12/02/2014   Your procedure is scheduled on: 12/09/2014    Come thru the [facility] Entr[facility]  and follow signs to               Short Stay Center at  230pm  Call this number if you have problems the morning of surgery [phone]   Remember:  Do not eat food after midnite. May have clear liquids until 0930am then npo.       Take these medicines the morning of surgery with A SIP OF WATER:                                You may not have any metal on your body including hair pins and              piercings  Do not wear jewelry, make-up, lotions, powders or perfumes.             Do not wear nail polish.  Do not shave  48 hours prior to surgery.                Do not bring valuables to the hospital. [facility] IS NOT             RESPONSIBLE   FOR VALUABLES.  Contacts, dentures or bridgework may not be worn into surgery.  Leave suitcase in the car. After surgery it may be brought to your room.          CLEAR LIQUID DIET   Foods Allowed                                                                     Foods Excluded  Coffee and tea, regular and decaf                             liquids that you cannot  Plain Jell-O in any flavor                                             see through such as: Fruit ices (not with fruit pulp)                                     milk, soups, orange juice  Iced Popsicles                                    All solid food Carbonated beverages, regular and diet                                    Cranberry, grape and apple juices Sports drinks like Gatorade Lightly seasoned clear broth or consume(fat free) Sugar, honey syrup  Sample Menu Breakfast  Lunch                                     Supper Cranberry juice                    Beef broth                            Chicken broth Jell-O                                     Grape juice                           Apple juice Coffee or  tea                        Jell-O                                      Popsicle                                                Coffee or tea                        Coffee or tea  _____________________________________________________________________                Please read over the following fact sheets you were given: _____________________________________________________________________             [facility] Pre[facility] Before surgery, you can play an important role.  Because skin is not sterile, your skin needs to be as free of germs as possible.  You can reduce the number of germs on your skin by washing with CHG (chlorahexidine gluconate) soap before surgery.  CHG is an antiseptic cleaner which kills germs and bonds with the skin to continue killing germs even after washing. Please DO NOT use if you have an allergy to CHG or antibacterial soaps.  If your skin becomes reddened/irritated stop using the CHG and inform your nurse when you arrive at Short Stay. Do not shave (including legs and underarms) for at least 48 hours prior to the first CHG shower.  You may shave your face/neck. Please follow these instructions carefully:  1.  Shower with CHG Soap the night before surgery and the  morning of Surgery.  2.  If you choose to wash your hair, wash your hair first as usual with your  normal  shampoo.  3.  After you shampoo, rinse your hair and body thoroughly to remove the  shampoo.                           4.  Use CHG as you would any other liquid soap.  You can apply chg directly  to the skin and wash  Gently with a scrungie or clean washcloth.  5.  Apply the CHG Soap to your body ONLY FROM THE NECK DOWN.   Do not use on face/ open                           Wound or open sores. Avoid contact with eyes, ears mouth and genitals (private parts).                       Wash face,  Genitals (private parts) with your normal soap.             6.  Wash  thoroughly, paying special attention to the area where your surgery  will be performed.  7.  Thoroughly rinse your body with warm water from the neck down.  8.  DO NOT shower/wash with your normal soap after using and rinsing off  the CHG Soap.                9.  Pat yourself dry with a clean towel.            10.  Wear clean pajamas.            11.  Place clean sheets on your bed the night of your first shower and do not  sleep with pets. Day of Surgery : Do not apply any lotions/deodorants the morning of surgery.  Please wear clean clothes to the hospital/surgery center.  FAILURE TO FOLLOW THESE INSTRUCTIONS MAY RESULT IN THE CANCELLATION OF YOUR SURGERY PATIENT SIGNATURE_________________________________  NURSE SIGNATURE__________________________________  ________________________________________________________________________   Adam Phenix  An incentive spirometer is a tool that can help keep your lungs clear and active. This tool measures how well you are filling your lungs with each breath. Taking long deep breaths may help reverse or decrease the chance of developing breathing (pulmonary) problems (especially infection) following:  A long period of time when you are unable to move or be active. BEFORE THE PROCEDURE   If the spirometer includes an indicator to show your best effort, your nurse or respiratory therapist will set it to a desired goal.  If possible, sit up straight or lean slightly forward. Try not to slouch.  Hold the incentive spirometer in an upright position. INSTRUCTIONS FOR USE  1. Sit on the edge of your bed if possible, or sit up as far as you can in bed or on a chair. 2. Hold the incentive spirometer in an upright position. 3. Breathe out normally. 4. Place the mouthpiece in your mouth and seal your lips tightly around it. 5. Breathe in slowly and as deeply as possible, raising the piston or the ball toward the top of the column. 6. Hold your  breath for 3-5 seconds or for as long as possible. Allow the piston or ball to fall to the bottom of the column. 7. Remove the mouthpiece from your mouth and breathe out normally. 8. Rest for a few seconds and repeat Steps 1 through 7 at least 10 times every 1-2 hours when you are awake. Take your time and take a few normal breaths between deep breaths. 9. The spirometer may include an indicator to show your best effort. Use the indicator as a goal to work toward during each repetition. 10. After each set of 10 deep breaths, practice coughing to be sure your lungs are clear. If you have an incision (the cut made at the time of  surgery), support your incision when coughing by placing a pillow or rolled up towels firmly against it. Once you are able to get out of bed, walk around indoors and cough well. You may stop using the incentive spirometer when instructed by your caregiver.  RISKS AND COMPLICATIONS  Take your time so you do not get dizzy or light-headed.  If you are in pain, you may need to take or ask for pain medication before doing incentive spirometry. It is harder to take a deep breath if you are having pain. AFTER USE  Rest and breathe slowly and easily.  It can be helpful to keep track of a log of your progress. Your caregiver can provide you with a simple table to help with this. If you are using the spirometer at home, follow these instructions: SEEK MEDICAL CARE IF:   You are having difficultly using the spirometer.  You have trouble using the spirometer as often as instructed.  Your pain medication is not giving enough relief while using the spirometer.  You develop fever of 100.5 F (38.1 C) or higher. SEEK IMMEDIATE MEDICAL CARE IF:   You cough up bloody sputum that had not been present before.  You develop fever of 102 F (38.9 C) or greater.  You develop worsening pain at or near the incision site. MAKE SURE YOU:   Understand these instructions.  Will watch  your condition.  Will get help right away if you are not doing well or get worse. Document Released: 04/23/2007 Document Revised: 03/04/2012 Document Reviewed: 06/24/2007 [facility] Patient Information 2014 [employer], [place].   ________________________________________________________________________

## 2014-12-03 ENCOUNTER — Encounter (HOSPITAL_COMMUNITY): Payer: Self-pay | Admitting: Interventional Cardiology

## 2014-12-04 ENCOUNTER — Inpatient Hospital Stay (HOSPITAL_COMMUNITY)
Admission: RE | Admit: 2014-12-04 | Discharge: 2014-12-04 | Disposition: A | Discharge status: Home / Self Care | Payer: BC Managed Care – PPO | Source: Ambulatory Visit

## 2014-12-07 ENCOUNTER — Encounter (HOSPITAL_COMMUNITY)
Admission: RE | Admit: 2014-12-07 | Discharge: 2014-12-07 | Disposition: A | Discharge status: Home / Self Care | Payer: BC Managed Care – PPO | Source: Ambulatory Visit | Attending: Orthopedic Surgery | Admitting: Orthopedic Surgery

## 2014-12-07 ENCOUNTER — Encounter (HOSPITAL_COMMUNITY): Payer: Self-pay

## 2014-12-07 DIAGNOSIS — Z79899 Other long term (current) drug therapy: Secondary | ICD-10-CM | POA: Diagnosis not present

## 2014-12-07 DIAGNOSIS — K219 Gastro-esophageal reflux disease without esophagitis: Secondary | ICD-10-CM | POA: Diagnosis not present

## 2014-12-07 DIAGNOSIS — M24662 Ankylosis, left knee: Secondary | ICD-10-CM | POA: Diagnosis present

## 2014-12-07 DIAGNOSIS — M179 Osteoarthritis of knee, unspecified: Secondary | ICD-10-CM | POA: Diagnosis not present

## 2014-12-07 DIAGNOSIS — F419 Anxiety disorder, unspecified: Secondary | ICD-10-CM | POA: Diagnosis not present

## 2014-12-07 LAB — CBC
HCT: 37.1 % (ref 36.0–46.0)
Hemoglobin: 11.6 g/dL — ABNORMAL LOW (ref 12.0–15.0)
MCH: 28.4 pg (ref 26.0–34.0)
MCHC: 31.3 g/dL (ref 30.0–36.0)
MCV: 90.7 fL (ref 78.0–100.0)
PLATELETS: 275 10*3/uL (ref 150–400)
RBC: 4.09 MIL/uL (ref 3.87–5.11)
RDW: 15.1 % (ref 11.5–15.5)
WBC: 8.5 10*3/uL (ref 4.0–10.5)

## 2014-12-07 NOTE — Patient Instructions (Signed)
Samantha Benton  12/07/2014   Your procedure is scheduled on: Wednesday 12/09/2014  [facility]  [facility] and follow signs to               Short Stay Center at   230 PM.  Call this number if you have problems the morning of surgery [phone]   Remember:  Do not eat food After Midnight.!  MAY HAVE CLEAR LIQUIDS FROM MIDNIGHT UP UNTIL 1030 AM THEN NOTHING UNTIL AFTER SURGERY!     Take these medicines the morning of surgery with A SIP OF WATER: none                               You may not have any metal on your body including hair pins and              piercings  Do not wear jewelry, make-up, lotions, powders or perfumes.             Do not wear nail polish.  Do not shave  48 hours prior to surgery.              Men may shave face and neck.   Do not bring valuables to the hospital. [facility] IS NOT             RESPONSIBLE   FOR VALUABLES.  Contacts, dentures or bridgework may not be worn into surgery.  Leave suitcase in the car. After surgery it may be brought to your room.     Patients discharged the day of surgery will not be allowed to drive home.  Name and phone number of your driver:  Special Instructions: N/A              Please read over the following fact sheets you were given: _____________________________________________________________________             [facility] - Preparing for Surgery Before surgery, you can play an important role.  Because skin is not sterile, your skin needs to be as free of germs as possible.  You can reduce the number of germs on your skin by washing with CHG (chlorahexidine gluconate) soap before surgery.  CHG is an antiseptic cleaner which kills germs and bonds with the skin to continue killing germs even after washing. Please DO NOT use if you have an allergy to CHG or antibacterial soaps.  If your skin becomes reddened/irritated stop using the CHG and inform your nurse when you arrive at Short  Stay. Do not shave (including legs and underarms) for at least 48 hours prior to the first CHG shower.  You may shave your face/neck. Please follow these instructions carefully:  1.  Shower with CHG Soap the night before surgery and the  morning of Surgery.  2.  If you choose to wash your hair, wash your hair first as usual with your  normal  shampoo.  3.  After you shampoo, rinse your hair and body thoroughly to remove the  shampoo.                           4.  Use CHG as you would any other liquid soap.  You can apply chg directly  to the skin and wash  Gently with a scrungie or clean washcloth.  5.  Apply the CHG Soap to your body ONLY FROM THE NECK DOWN.   Do not use on face/ open                           Wound or open sores. Avoid contact with eyes, ears mouth and genitals (private parts).                       Wash face,  Genitals (private parts) with your normal soap.             6.  Wash thoroughly, paying special attention to the area where your surgery  will be performed.  7.  Thoroughly rinse your body with warm water from the neck down.  8.  DO NOT shower/wash with your normal soap after using and rinsing off  the CHG Soap.                9.  Pat yourself dry with a clean towel.            10.  Wear clean pajamas.            11.  Place clean sheets on your bed the night of your first shower and do not  sleep with pets. Day of Surgery : Do not apply any lotions/deodorants the morning of surgery.  Please wear clean clothes to the hospital/surgery center.  FAILURE TO FOLLOW THESE INSTRUCTIONS MAY RESULT IN THE CANCELLATION OF YOUR SURGERY PATIENT SIGNATURE_________________________________  NURSE SIGNATURE__________________________________  ________________________________________________________________________   Adam Phenix  An incentive spirometer is a tool that can help keep your lungs clear and active. This tool measures how well you are  filling your lungs with each breath. Taking long deep breaths may help reverse or decrease the chance of developing breathing (pulmonary) problems (especially infection) following:  A long period of time when you are unable to move or be active. BEFORE THE PROCEDURE   If the spirometer includes an indicator to show your best effort, your nurse or respiratory therapist will set it to a desired goal.  If possible, sit up straight or lean slightly forward. Try not to slouch.  Hold the incentive spirometer in an upright position. INSTRUCTIONS FOR USE  1. Sit on the edge of your bed if possible, or sit up as far as you can in bed or on a chair. 2. Hold the incentive spirometer in an upright position. 3. Breathe out normally. 4. Place the mouthpiece in your mouth and seal your lips tightly around it. 5. Breathe in slowly and as deeply as possible, raising the piston or the ball toward the top of the column. 6. Hold your breath for 3-5 seconds or for as long as possible. Allow the piston or ball to fall to the bottom of the column. 7. Remove the mouthpiece from your mouth and breathe out normally. 8. Rest for a few seconds and repeat Steps 1 through 7 at least 10 times every 1-2 hours when you are awake. Take your time and take a few normal breaths between deep breaths. 9. The spirometer may include an indicator to show your best effort. Use the indicator as a goal to work toward during each repetition. 10. After each set of 10 deep breaths, practice coughing to be sure your lungs are clear. If you have an incision (the cut made at the time of surgery),  support your incision when coughing by placing a pillow or rolled up towels firmly against it. Once you are able to get out of bed, walk around indoors and cough well. You may stop using the incentive spirometer when instructed by your caregiver.  RISKS AND COMPLICATIONS  Take your time so you do not get dizzy or light-headed.  If you are in pain,  you may need to take or ask for pain medication before doing incentive spirometry. It is harder to take a deep breath if you are having pain. AFTER USE  Rest and breathe slowly and easily.  It can be helpful to keep track of a log of your progress. Your caregiver can provide you with a simple table to help with this. If you are using the spirometer at home, follow these instructions: Summers IF:   You are having difficultly using the spirometer.  You have trouble using the spirometer as often as instructed.  Your pain medication is not giving enough relief while using the spirometer.  You develop fever of 100.5 F (38.1 C) or higher. SEEK IMMEDIATE MEDICAL CARE IF:   You cough up bloody sputum that had not been present before.  You develop fever of 102 F (38.9 C) or greater.  You develop worsening pain at or near the incision site. MAKE SURE YOU:   Understand these instructions.  Will watch your condition.  Will get help right away if you are not doing well or get worse. Document Released: 04/23/2007 Document Revised: 03/04/2012 Document Reviewed: 06/24/2007 ExitCare Patient Information 2014 [place].   ________________________________________________________________________    CLEAR LIQUID DIET   Foods Allowed                                                                     Foods Excluded  Coffee and tea, regular and decaf                             liquids that you cannot  Plain Jell-O in any flavor                                             see through such as: Fruit ices (not with fruit pulp)                                     milk, soups, orange juice  Iced Popsicles                                    All solid food Carbonated beverages, regular and diet                                    Cranberry, grape and apple juices Sports drinks like Gatorade Lightly seasoned clear broth or consume(fat free) Sugar, honey syrup  Sample  Menu Breakfast  Lunch                                     Supper Cranberry juice                    Beef broth                            Chicken broth Jell-O                                     Grape juice                           Apple juice Coffee or tea                        Jell-O                                      Popsicle                                                Coffee or tea                        Coffee or tea  _____________________________________________________________________

## 2014-12-09 ENCOUNTER — Encounter (HOSPITAL_COMMUNITY): Payer: Self-pay | Admitting: *Deleted

## 2014-12-09 ENCOUNTER — Ambulatory Visit (HOSPITAL_COMMUNITY): Payer: BC Managed Care – PPO | Admitting: *Deleted

## 2014-12-09 ENCOUNTER — Observation Stay (HOSPITAL_COMMUNITY)
Admission: RE | Admit: 2014-12-09 | Discharge: 2014-12-10 | Disposition: A | Discharge status: Home / Self Care | Payer: BC Managed Care – PPO | Source: Ambulatory Visit | Attending: Orthopedic Surgery | Admitting: Orthopedic Surgery

## 2014-12-09 ENCOUNTER — Encounter (HOSPITAL_COMMUNITY)
Admission: RE | Disposition: A | Discharge status: Home / Self Care | Payer: Self-pay | Source: Ambulatory Visit | Attending: Orthopedic Surgery

## 2014-12-09 DIAGNOSIS — K219 Gastro-esophageal reflux disease without esophagitis: Secondary | ICD-10-CM | POA: Insufficient documentation

## 2014-12-09 DIAGNOSIS — Z96659 Presence of unspecified artificial knee joint: Secondary | ICD-10-CM

## 2014-12-09 DIAGNOSIS — Z79899 Other long term (current) drug therapy: Secondary | ICD-10-CM | POA: Insufficient documentation

## 2014-12-09 DIAGNOSIS — F419 Anxiety disorder, unspecified: Secondary | ICD-10-CM | POA: Insufficient documentation

## 2014-12-09 DIAGNOSIS — M179 Osteoarthritis of knee, unspecified: Secondary | ICD-10-CM | POA: Insufficient documentation

## 2014-12-09 DIAGNOSIS — T8489XA Other specified complication of internal orthopedic prosthetic devices, implants and grafts, initial encounter: Secondary | ICD-10-CM

## 2014-12-09 DIAGNOSIS — M24662 Ankylosis, left knee: Principal | ICD-10-CM | POA: Insufficient documentation

## 2014-12-09 DIAGNOSIS — M24669 Ankylosis, unspecified knee: Secondary | ICD-10-CM | POA: Diagnosis present

## 2014-12-09 DIAGNOSIS — M25669 Stiffness of unspecified knee, not elsewhere classified: Secondary | ICD-10-CM | POA: Diagnosis present

## 2014-12-09 HISTORY — PX: KNEE ARTHROTOMY: SHX5881

## 2014-12-09 HISTORY — DX: Family history of other specified conditions: Z84.89

## 2014-12-09 SURGERY — ARTHROTOMY, KNEE
Anesthesia: General | Site: Knee | Laterality: Left

## 2014-12-09 MED ORDER — 0.9 % SODIUM CHLORIDE (POUR BTL) OPTIME
TOPICAL | Status: DC | PRN
  Administered 2014-12-09: 1000 mL

## 2014-12-09 MED ORDER — LACTATED RINGERS IV SOLN: INTRAVENOUS | Status: DC

## 2014-12-09 MED ORDER — SODIUM CHLORIDE 0.9 % IV SOLN: INTRAVENOUS | Status: DC

## 2014-12-09 MED ORDER — DEXTROSE 5 % IV SOLN
500.0000 mg | Freq: Four times a day (QID) | INTRAVENOUS | Status: DC | PRN
  Administered 2014-12-09: 500 mg via INTRAVENOUS
  Filled 2014-12-09 (×2): qty 5

## 2014-12-09 MED ORDER — LACTATED RINGERS IV SOLN
INTRAVENOUS | Status: DC | PRN
  Administered 2014-12-09: 17:00:00 via INTRAVENOUS

## 2014-12-09 MED ORDER — ONDANSETRON HCL 4 MG/2ML IJ SOLN
INTRAMUSCULAR | Status: DC | PRN
  Administered 2014-12-09: 4 mg via INTRAVENOUS

## 2014-12-09 MED ORDER — PANTOPRAZOLE SODIUM 40 MG PO TBEC
80.0000 mg | DELAYED_RELEASE_TABLET | Freq: Every day | ORAL | Status: DC
  Administered 2014-12-10: 80 mg via ORAL
  Filled 2014-12-09 (×2): qty 2

## 2014-12-09 MED ORDER — ACETAMINOPHEN 10 MG/ML IV SOLN
INTRAVENOUS | Status: DC | PRN
  Administered 2014-12-09: 1000 mg via INTRAVENOUS

## 2014-12-09 MED ORDER — VANCOMYCIN HCL IN DEXTROSE 1-5 GM/200ML-% IV SOLN
1000.0000 mg | INTRAVENOUS | Status: AC
  Administered 2014-12-09: 1000 mg via INTRAVENOUS

## 2014-12-09 MED ORDER — FENTANYL CITRATE 0.05 MG/ML IJ SOLN: 25.0000 ug | INTRAMUSCULAR | Status: DC | PRN

## 2014-12-09 MED ORDER — ONDANSETRON HCL 4 MG PO TABS: 4.0000 mg | ORAL_TABLET | Freq: Four times a day (QID) | ORAL | Status: DC | PRN

## 2014-12-09 MED ORDER — TRAMADOL HCL 50 MG PO TABS: 50.0000 mg | ORAL_TABLET | Freq: Four times a day (QID) | ORAL | Status: DC | PRN

## 2014-12-09 MED ORDER — PROPOFOL 10 MG/ML IV BOLUS
INTRAVENOUS | Status: AC
Start: 2014-12-09 — End: 2014-12-09
  Filled 2014-12-09: qty 20

## 2014-12-09 MED ORDER — ACETAMINOPHEN 10 MG/ML IV SOLN
1000.0000 mg | Freq: Once | INTRAVENOUS | Status: DC
  Filled 2014-12-09: qty 100

## 2014-12-09 MED ORDER — MIDAZOLAM HCL 2 MG/2ML IJ SOLN
INTRAMUSCULAR | Status: AC
  Filled 2014-12-09: qty 2

## 2014-12-09 MED ORDER — FENTANYL CITRATE 0.05 MG/ML IJ SOLN
INTRAMUSCULAR | Status: AC
  Filled 2014-12-09: qty 2

## 2014-12-09 MED ORDER — ENOXAPARIN SODIUM 40 MG/0.4ML ~~LOC~~ SOLN
40.0000 mg | SUBCUTANEOUS | Status: DC
  Administered 2014-12-10: 40 mg via SUBCUTANEOUS
  Filled 2014-12-09 (×2): qty 0.4

## 2014-12-09 MED ORDER — MIDAZOLAM HCL 5 MG/5ML IJ SOLN
INTRAMUSCULAR | Status: DC | PRN
  Administered 2014-12-09: 2 mg via INTRAVENOUS

## 2014-12-09 MED ORDER — ONDANSETRON HCL 4 MG/2ML IJ SOLN
INTRAMUSCULAR | Status: AC
  Filled 2014-12-09: qty 2

## 2014-12-09 MED ORDER — HYDROMORPHONE HCL 1 MG/ML IJ SOLN
0.2500 mg | INTRAMUSCULAR | Status: DC | PRN
Start: 2014-12-09 — End: 2014-12-09
  Administered 2014-12-09 (×2): 0.5 mg via INTRAVENOUS

## 2014-12-09 MED ORDER — DEXAMETHASONE SODIUM PHOSPHATE 10 MG/ML IJ SOLN: 10.0000 mg | Freq: Once | INTRAMUSCULAR | Status: DC

## 2014-12-09 MED ORDER — PROMETHAZINE HCL 25 MG/ML IJ SOLN: 6.2500 mg | INTRAMUSCULAR | Status: DC | PRN

## 2014-12-09 MED ORDER — LIDOCAINE HCL (CARDIAC) 20 MG/ML IV SOLN
INTRAVENOUS | Status: DC | PRN
  Administered 2014-12-09: 50 mg via INTRAVENOUS

## 2014-12-09 MED ORDER — FENTANYL CITRATE 0.05 MG/ML IJ SOLN
INTRAMUSCULAR | Status: DC | PRN
  Administered 2014-12-09 (×2): 25 ug via INTRAVENOUS
  Administered 2014-12-09 (×3): 50 ug via INTRAVENOUS

## 2014-12-09 MED ORDER — METOCLOPRAMIDE HCL 5 MG/ML IJ SOLN: 5.0000 mg | Freq: Three times a day (TID) | INTRAMUSCULAR | Status: DC | PRN

## 2014-12-09 MED ORDER — MORPHINE SULFATE 2 MG/ML IJ SOLN: 1.0000 mg | INTRAMUSCULAR | Status: DC | PRN

## 2014-12-09 MED ORDER — ONDANSETRON HCL 4 MG/2ML IJ SOLN
4.0000 mg | Freq: Four times a day (QID) | INTRAMUSCULAR | Status: DC | PRN
  Administered 2014-12-10: 4 mg via INTRAVENOUS
  Filled 2014-12-09: qty 2

## 2014-12-09 MED ORDER — HYDROMORPHONE HCL 1 MG/ML IJ SOLN
INTRAMUSCULAR | Status: AC
  Filled 2014-12-09: qty 1

## 2014-12-09 MED ORDER — METHOCARBAMOL 500 MG PO TABS: 500.0000 mg | ORAL_TABLET | Freq: Four times a day (QID) | ORAL | Status: DC | PRN

## 2014-12-09 MED ORDER — PROPOFOL 10 MG/ML IV BOLUS
INTRAVENOUS | Status: DC | PRN
  Administered 2014-12-09: 30 mg via INTRAVENOUS
  Administered 2014-12-09: 120 mg via INTRAVENOUS

## 2014-12-09 MED ORDER — VANCOMYCIN HCL IN DEXTROSE 1-5 GM/200ML-% IV SOLN
INTRAVENOUS | Status: AC
  Filled 2014-12-09: qty 200

## 2014-12-09 MED ORDER — METOCLOPRAMIDE HCL 10 MG PO TABS: 5.0000 mg | ORAL_TABLET | Freq: Three times a day (TID) | ORAL | Status: DC | PRN

## 2014-12-09 MED ORDER — OXYCODONE HCL 5 MG PO TABS
5.0000 mg | ORAL_TABLET | ORAL | Status: DC | PRN
  Administered 2014-12-09 – 2014-12-10 (×2): 10 mg via ORAL
  Filled 2014-12-09 (×3): qty 2

## 2014-12-09 SURGICAL SUPPLY — 43 items
BAG SPEC THK2 15X12 ZIP CLS (MISCELLANEOUS) ×1
BAG ZIPLOCK 12X15 (MISCELLANEOUS) ×2 IMPLANT
BANDAGE ELASTIC 6 VELCRO ST LF (GAUZE/BANDAGES/DRESSINGS) ×2 IMPLANT
BANDAGE ESMARK 6X9 LF (GAUZE/BANDAGES/DRESSINGS) ×1 IMPLANT
BNDG CMPR 9X6 STRL LF SNTH (GAUZE/BANDAGES/DRESSINGS) ×1
BNDG ESMARK 6X9 LF (GAUZE/BANDAGES/DRESSINGS) ×2
CUFF TOURN SGL QUICK 34 (TOURNIQUET CUFF) ×2
CUFF TRNQT CYL 34X4X40X1 (TOURNIQUET CUFF) IMPLANT
DRAPE EXTREMITY T 121X128X90 (DRAPE) ×2 IMPLANT
DRAPE U-SHAPE 47X51 STRL (DRAPES) ×2 IMPLANT
DRSG ADAPTIC 3X8 NADH LF (GAUZE/BANDAGES/DRESSINGS) ×2 IMPLANT
DRSG EMULSION OIL 3X16 NADH (GAUZE/BANDAGES/DRESSINGS) ×1 IMPLANT
DRSG PAD ABDOMINAL 8X10 ST (GAUZE/BANDAGES/DRESSINGS) ×3 IMPLANT
DURAPREP 26ML APPLICATOR (WOUND CARE) ×2 IMPLANT
ELECT REM PT RETURN 9FT ADLT (ELECTROSURGICAL) ×2
ELECTRODE REM PT RTRN 9FT ADLT (ELECTROSURGICAL) ×1 IMPLANT
EVACUATOR 1/8 PVC DRAIN (DRAIN) ×1 IMPLANT
GAUZE SPONGE 4X4 12PLY STRL (GAUZE/BANDAGES/DRESSINGS) ×2 IMPLANT
GLOVE BIO SURGEON STRL SZ7.5 (GLOVE) ×2 IMPLANT
GLOVE BIO SURGEON STRL SZ8 (GLOVE) ×2 IMPLANT
GLOVE BIOGEL PI IND STRL 7.5 (GLOVE) IMPLANT
GLOVE BIOGEL PI IND STRL 8 (GLOVE) ×3 IMPLANT
GLOVE BIOGEL PI INDICATOR 7.5 (GLOVE) ×1
GLOVE BIOGEL PI INDICATOR 8 (GLOVE) ×2
GLOVE SURG SS PI 7.5 STRL IVOR (GLOVE) ×1 IMPLANT
GOWN STRL REIN 3XL XLG LVL4 (GOWN DISPOSABLE) ×1 IMPLANT
GOWN STRL REUS W/TWL LRG LVL3 (GOWN DISPOSABLE) ×2 IMPLANT
GOWN STRL REUS W/TWL XL LVL3 (GOWN DISPOSABLE) ×2 IMPLANT
IMMOBILIZER KNEE 20 (SOFTGOODS) ×1 IMPLANT
KIT BASIN OR (CUSTOM PROCEDURE TRAY) ×2 IMPLANT
MANIFOLD NEPTUNE II (INSTRUMENTS) ×2 IMPLANT
PACK TOTAL JOINT (CUSTOM PROCEDURE TRAY) ×2 IMPLANT
PAD ABD 8X10 STRL (GAUZE/BANDAGES/DRESSINGS) ×1 IMPLANT
PADDING CAST COTTON 6X4 STRL (CAST SUPPLIES) ×5 IMPLANT
POSITIONER SURGICAL ARM (MISCELLANEOUS) ×2 IMPLANT
STRIP CLOSURE SKIN 1/2X4 (GAUZE/BANDAGES/DRESSINGS) ×4 IMPLANT
SUT MNCRL AB 4-0 PS2 18 (SUTURE) ×2 IMPLANT
SUT PDS AB 1 CT1 27 (SUTURE) ×2 IMPLANT
SUT VIC AB 2-0 CT1 27 (SUTURE) ×4
SUT VIC AB 2-0 CT1 TAPERPNT 27 (SUTURE) ×2 IMPLANT
SUT VLOC 180 0 24IN GS25 (SUTURE) ×2 IMPLANT
TOWEL OR 17X26 10 PK STRL BLUE (TOWEL DISPOSABLE) ×4 IMPLANT
WRAP KNEE MAXI GEL POST OP (GAUZE/BANDAGES/DRESSINGS) ×2 IMPLANT

## 2014-12-09 NOTE — Interval H&P Note (Signed)
History and Physical Interval Note:  12/09/2014 5:40 PM  Samantha Benton  has presented today for surgery, with the diagnosis of ARTHOFIBROSIS OF THE LEFT KNEE  The various methods of treatment have been discussed with the patient and family. After consideration of risks, benefits and other options for treatment, the patient has consented to  Procedure(s): LEFT KNEE ARTHROTOMY WITH SCAR EXCISION (Left) as a surgical intervention .  The patient's history has been reviewed, patient examined, no change in status, stable for surgery.  I have reviewed the patient's chart and labs.  Questions were answered to the patient's satisfaction.     Loanne DrillingALUISIO,Josip Merolla V

## 2014-12-09 NOTE — Brief Op Note (Signed)
12/09/2014  6:31 PM  PATIENT:  Samantha Benton  58 y.o. female  PRE-OPERATIVE DIAGNOSIS:  ARTHOFIBROSIS OF THE LEFT KNEE  POST-OPERATIVE DIAGNOSIS:  arthofibrosis of left knee  PROCEDURE:  Procedure(s): LEFT KNEE ARTHROTOMY WITH SCAR EXCISION (Left)  SURGEON:  Surgeon(s) and Role:    * Loanne DrillingFrank Lexie Morini V, MD - Primary  PHYSICIAN ASSISTANT:   ASSISTANTS: Avel Peacerew Perkins, PA-C   ANESTHESIA:   general  EBL:     BLOOD ADMINISTERED:none  DRAINS: (Medium) Hemovact drain(s) in the left knee with  Suction Open   LOCAL MEDICATIONS USED:  NONE  COUNTS:  YES  TOURNIQUET:   Total Tourniquet Time Documented: Thigh (Left) - 21 minutes Total: Thigh (Left) - 21 minutes   DICTATION: .Other Dictation: Dictation Number [id number]  PLAN OF CARE: Admit for overnight observation  PATIENT DISPOSITION:  PACU - hemodynamically stable.

## 2014-12-09 NOTE — Anesthesia Postprocedure Evaluation (Signed)
  Anesthesia Post-op Note  Patient: Samantha Benton  Procedure(s) Performed: Procedure(s) (LRB): LEFT KNEE ARTHROTOMY WITH SCAR EXCISION (Left)  Patient Location: PACU  Anesthesia Type: General  Level of Consciousness: awake and alert   Airway and Oxygen Therapy: Patient Spontanous Breathing  Post-op Pain: mild  Post-op Assessment: Post-op Vital signs reviewed, Patient's Cardiovascular Status Stable, Respiratory Function Stable, Patent Airway and No signs of Nausea or vomiting  Last Vitals:  Filed Vitals:   12/09/14 2004  BP: 127/66  Pulse: 80  Temp: 36.7 C  Resp: 16    Post-op Vital Signs: stable   Complications: No apparent anesthesia complications

## 2014-12-09 NOTE — Transfer of Care (Signed)
Immediate Anesthesia Transfer of Care Note  Patient: Samantha Benton  Procedure(s) Performed: Procedure(s) (LRB): LEFT KNEE ARTHROTOMY WITH SCAR EXCISION (Left)  Patient Location: PACU  Anesthesia Type: General  Level of Consciousness: sedated, patient cooperative and responds to stimulation  Airway & Oxygen Therapy: Patient Spontanous Breathing and Patient connected to face mask oxgen  Post-op Assessment: Report given to PACU RN and Post -op Vital signs reviewed and stable  Post vital signs: Reviewed and stable  Complications: No apparent anesthesia complications

## 2014-12-09 NOTE — Anesthesia Preprocedure Evaluation (Addendum)
Anesthesia Evaluation  Patient identified by MRN, date of birth, ID band Patient awake    Reviewed: Allergy & Precautions, H&P , NPO status , Patient's Chart, lab work & pertinent test results  History of Anesthesia Complications (+) Family history of anesthesia reaction  Airway Mallampati: II  TM Distance: >3 FB Neck ROM: Full    Dental no notable dental hx.    Pulmonary shortness of breath,  breath sounds clear to auscultation  Pulmonary exam normal       Cardiovascular Exercise Tolerance: Good negative cardio ROS  Rhythm:Regular Rate:Normal  ECG: NSR, prolonged QT   Neuro/Psych Anxiety negative neurological ROS     GI/Hepatic negative GI ROS, Neg liver ROS, GERD-  Medicated,  Endo/Other  negative endocrine ROS  Renal/GU negative Renal ROS  negative genitourinary   Musculoskeletal negative musculoskeletal ROS (+) Arthritis -,   Abdominal (+) + obese,   Peds negative pediatric ROS (+)  Hematology negative hematology ROS (+)   Anesthesia Other Findings   Reproductive/Obstetrics negative OB ROS                            Anesthesia Physical Anesthesia Plan  ASA: II  Anesthesia Plan: General   Post-op Pain Management:    Induction: Intravenous  Airway Management Planned: LMA  Additional Equipment:   Intra-op Plan:   Post-operative Plan: Extubation in OR  Informed Consent: I have reviewed the patients History and Physical, chart, labs and discussed the procedure including the risks, benefits and alternatives for the proposed anesthesia with the patient or authorized representative who has indicated his/her understanding and acceptance.   Dental advisory given  Plan Discussed with: CRNA  Anesthesia Plan Comments: (Discussed spinal and general. She prefers general.)       Anesthesia Quick Evaluation

## 2014-12-09 NOTE — Progress Notes (Signed)
Patient informed that surgery is delayed as Careers advisersurgeon is running behind. She verbalizes understanding.

## 2014-12-09 NOTE — H&P (Signed)
  CC- Bernadene BellDebora Wieland is a 58 y.o. female who presents with left knee pain.  HPI- . Knee Pain: Patient presents with stiffness involving the  left knee. Onset of the symptoms was several years ago. Inciting event: She had a left TKA by Dr. Hyacinth MeekerMiller on 04/30/12 and has had stiffness which has gotten progressively worse over time Current symptoms include stiffness. Pain is aggravated by going up and down stairs, rising after sitting and walking.  Patient has had prior knee problems. Evaluation to date: plain films: normal. Treatment to date: PT which was not very effective.  Past Medical History  Diagnosis Date  . Anxiety   . Arthritis     right knee  . GERD (gastroesophageal reflux disease)     Past Surgical History  Procedure Laterality Date  . Cholecystectomy  1970  . Carpal tunnel release  2008  . Knee arthroscopy  2002    right knee  . Tubal ligation    . Gallstone removal    . Left heart catheterization with coronary angiogram N/A 11/13/2014    Procedure: LEFT HEART CATHETERIZATION WITH CORONARY ANGIOGRAM;  Surgeon: Corky CraftsJayadeep S Varanasi, MD;  [facility];  Service: Cardiovascular;  Laterality: N/A;    Prior to Admission medications   Medication Sig Start Date End Date Taking? Authorizing Provider  cholecalciferol (VITAMIN D) 1000 UNITS tablet Take 1,000 Units by mouth every morning.    Yes Historical Provider, MD  ibuprofen (ADVIL,MOTRIN) 200 MG tablet Take 200 mg by mouth every 6 (six) hours as needed for headache or moderate pain.    Yes Historical Provider, MD  meloxicam (MOBIC) 7.5 MG tablet Take 7.5 mg by mouth daily as needed for pain.   Yes Historical Provider, MD  omeprazole (PRILOSEC) 40 MG capsule Take 40 mg by mouth every evening.    Yes Historical Provider, MD   KNEE EXAM antalgic gait, effusion, reduced range of motion 5-70  Physical Examination: General appearance - alert, well appearing, and in no distress Mental status - alert, oriented to person, place, and  time Chest - clear to auscultation, no wheezes, rales or rhonchi, symmetric air entry Heart - normal rate, regular rhythm, normal S1, S2, no murmurs, rubs, clicks or gallops Abdomen - soft, nontender, nondistended, no masses or organomegaly Neurological - alert, oriented, normal speech, no focal findings or movement disorder noted    Asessment/Plan--- Left knee arthrofibrosis- - Plan left knee arthrotomy with scar excision. Procedure risks and potential comps discussed with patient who elects to proceed. Goals are decreased pain and increased function with a high likelihood of achieving both

## 2014-12-10 ENCOUNTER — Encounter (HOSPITAL_COMMUNITY): Payer: Self-pay | Admitting: Orthopedic Surgery

## 2014-12-10 DIAGNOSIS — M24662 Ankylosis, left knee: Secondary | ICD-10-CM | POA: Diagnosis not present

## 2014-12-10 MED ORDER — ONDANSETRON HCL 4 MG PO TABS: 4.0000 mg | ORAL_TABLET | Freq: Four times a day (QID) | ORAL | Status: DC | PRN

## 2014-12-10 MED ORDER — METHOCARBAMOL 500 MG PO TABS: 500.0000 mg | ORAL_TABLET | Freq: Four times a day (QID) | ORAL | Status: DC | PRN

## 2014-12-10 MED ORDER — TRAMADOL HCL 50 MG PO TABS: 50.0000 mg | ORAL_TABLET | Freq: Four times a day (QID) | ORAL | Status: DC | PRN

## 2014-12-10 MED ORDER — OXYCODONE HCL 5 MG PO TABS: 5.0000 mg | ORAL_TABLET | ORAL | Status: DC | PRN

## 2014-12-10 NOTE — Progress Notes (Signed)
Utilization review completed.  

## 2014-12-10 NOTE — Progress Notes (Signed)
RN reviewed discharge instructions with patient and husband. All questions answered.   Patient vomited a small amount at 1600, but was still addiment about going home. Patient was prescribed an antiemetic. She felt like it was the medication. I educated her about the differences between oxycodone, and tramadol, and the patient felt comfortable with both medications, as well as managing her nausea at home.   Paperwork and prescriptions given to patient.   NT rolled patient down to family vehicle.

## 2014-12-10 NOTE — Evaluation (Signed)
Physical Therapy Evaluation Patient Details Name: Samantha BoerDebora Benton MRN: [id number] DOB: 06/28/1956 Today's Date: 12/10/2014   History of Present Illness  s/p LEFT KNEE ARTHROTOMY WITH SCAR EXCISION   Clinical Impression  Pt will benefit from PT to address deficits below, emphasis on increasing ROM L knee; pt had N/V earlier and has not been able to eat, may stay until tomorrow--will plan on seeing in am for further stair training and HEP;  Contact PTif she should decide to D/C today    Follow Up Recommendations Home health PT    Equipment Recommendations  None recommended by PT    Recommendations for Other Services       Precautions / Restrictions Precautions Precautions: Knee Restrictions Weight Bearing Restrictions: No Other Position/Activity Restrictions: WBAT LLE      Mobility  Bed Mobility Overal bed mobility: Needs Assistance Bed Mobility: Supine to Sit     Supine to sit: Min assist     General bed mobility comments: incr time and assist with LLE  Transfers Overall transfer level: Needs assistance Equipment used: Rolling walker (2 wheeled) Transfers: Sit to/from Stand Sit to Stand: Min assist;Min guard         General transfer comment: cues for hand placement  Ambulation/Gait Ambulation/Gait assistance: Min guard Ambulation Distance (Feet): 100 Feet Assistive device: Rolling walker (2 wheeled) Gait Pattern/deviations: Step-to pattern;Antalgic;Decreased step length - left;Decreased step length - right;Decreased stance time - left     General Gait Details: cues for RW position from self, use  of UEs for pain control LLE  Stairs            Wheelchair Mobility    Modified Rankin (Stroke Patients Only)       Balance                                             Pertinent Vitals/Pain Pain Assessment: 0-10 Pain Score: 3  Pain Location: left knee Pain Descriptors / Indicators: Constant Pain Intervention(s): Limited  activity within patient's tolerance;Monitored during session;Premedicated before session;Ice applied    Home Living Family/patient expects to be discharged to:: Private residence Living Arrangements: Spouse/significant other   Type of Home: House Home Access: Stairs to enter Entrance Stairs-Rails: None Secretary/administratorntrance Stairs-Number of Steps: 4 Home Layout: One level Home Equipment: Environmental consultantWalker - 2 wheels      Prior Function Level of Independence: Independent               Hand Dominance        Extremity/Trunk Assessment   Upper Extremity Assessment: Overall WFL for tasks assessed           Lower Extremity Assessment: LLE deficits/detail   LLE Deficits / Details: assist with SLR; ankle WFL; knee grossly -5 to 45* AAROM in supine, able to get ~ 55* in sitting with overpressure     Communication   Communication: No difficulties  Cognition Arousal/Alertness: Awake/alert Behavior During Therapy: WFL for tasks assessed/performed Overall Cognitive Status: Within Functional Limits for tasks assessed                      General Comments      Exercises Total Joint Exercises Heel Slides: AAROM;Left;15 reps Straight Leg Raises: AAROM;15 reps;Left Knee Flexion: AAROM;Left;Seated (x7) Goniometric ROM: see extremity section      Assessment/Plan    PT Assessment Patient needs continued  PT services  PT Diagnosis Difficulty walking   PT Problem List Decreased strength;Decreased range of motion;Decreased mobility;Pain  PT Treatment Interventions DME instruction;Gait training;Stair training;Functional mobility training;Therapeutic activities;Therapeutic exercise;Patient/family education   PT Goals (Current goals can be found in the Care Plan section) Acute Rehab PT Goals Patient Stated Goal: to get well, be able to bend knee PT Goal Formulation: With patient Time For Goal Achievement: 12/12/14 Potential to Achieve Goals: Good    Frequency 7X/week   Barriers to  discharge        Co-evaluation               End of Session Equipment Utilized During Treatment: Gait belt Activity Tolerance: Patient tolerated treatment well Patient left: with call bell/phone within reach;in chair;with family/visitor present           Time: [id number] PT Time Calculation (min) (ACUTE ONLY): 29 min   Charges:   PT Evaluation $Initial PT Evaluation Tier I: 1 Procedure PT Treatments $Gait Training: 8-22 mins $Therapeutic Exercise: 8-22 mins   PT G Codes:          Samantha Benton 12/10/2014, 11:45 AM

## 2014-12-10 NOTE — Discharge Instructions (Signed)
° °Dr. Frank Aluisio °Total Joint Specialist °[facility] °[address] °[place], [place] [address] °[phone] ° °TOTAL KNEE REPLACEMENT POSTOPERATIVE DIRECTIONS ° ° ° °Knee Rehabilitation, Guidelines Following Surgery  °Results after knee surgery are often greatly improved when you follow the exercise, range of motion and muscle strengthening exercises prescribed by your doctor. Safety measures are also important to protect the knee from further injury. Any time any of these exercises cause you to have increased pain or swelling in your knee joint, decrease the amount until you are comfortable again and slowly increase them. If you have problems or questions, call your caregiver or physical therapist for advice.  ° °HOME CARE INSTRUCTIONS  °Remove items at home which could result in a fall. This includes throw rugs or furniture in walking pathways.  °Continue medications as instructed at time of discharge. °You may have some home medications which will be placed on hold until you complete the course of blood thinner medication.  °You may start showering once you are discharged home but do not submerge the incision under water. Just pat the incision dry and apply a dry gauze dressing on daily. °Walk with walker as instructed.  °You may resume a sexual relationship in one month or when given the OK by  your doctor.  °· Use walker as long as suggested by your caregivers. °· Avoid periods of inactivity such as sitting longer than an hour when not asleep. This helps prevent blood clots.  °You may put full weight on your legs and walk as much as is comfortable.  °You may return to work once you are cleared by your doctor.  °Do not drive a car for 6 weeks or until released by you surgeon.  °· Do not drive while taking narcotics.  °Wear the elastic stockings for three weeks following surgery during the day but you may remove then at night. °Make sure you keep all of your appointments after your  operation with all of your doctors and caregivers. You should call the office at the above phone number and make an appointment for approximately two weeks after the date of your surgery. °Change the dressing daily and reapply a dry dressing each time. °Please pick up a stool softener and laxative for home use as long as you are requiring pain medications. °· ICE to the affected knee every three hours for 30 minutes at a time and then as needed for pain and swelling.  Continue to use ice on the knee for pain and swelling from surgery. You may notice swelling that will progress down to the foot and ankle.  This is normal after surgery.  Elevate the leg when you are not up walking on it.   °It is important for you to complete the blood thinner medication as prescribed by your doctor. °· Continue to use the breathing machine which will help keep your temperature down.  It is common for your temperature to cycle up and down following surgery, especially at night when you are not up moving around and exerting yourself.  The breathing machine keeps your lungs expanded and your temperature down. ° °RANGE OF MOTION AND STRENGTHENING EXERCISES  °Rehabilitation of the knee is important following a knee injury or an operation. After just a few days of immobilization, the muscles of the thigh which control the knee become weakened and shrink (atrophy). Knee exercises are designed to build up the tone and strength of the thigh muscles and to improve knee   motion. Often times heat used for twenty to thirty minutes before working out will loosen up your tissues and help with improving the range of motion but do not use heat for the first two weeks following surgery. These exercises can be done on a training (exercise) mat, on the floor, on a table or on a bed. Use what ever works the best and is most comfortable for you Knee exercises include:  Leg Lifts - While your knee is still immobilized in a splint or cast, you can do  straight leg raises. Lift the leg to 60 degrees, hold for 3 sec, and slowly lower the leg. Repeat 10-20 times 2-3 times daily. Perform this exercise against resistance later as your knee gets better.  Quad and Hamstring Sets - Tighten up the muscle on the front of the thigh (Quad) and hold for 5-10 sec. Repeat this 10-20 times hourly. Hamstring sets are done by pushing the foot backward against an object and holding for 5-10 sec. Repeat as with quad sets.  A rehabilitation program following serious knee injuries can speed recovery and prevent re-injury in the future due to weakened muscles. Contact your doctor or a physical therapist for more information on knee rehabilitation.   SKILLED REHAB INSTRUCTIONS: If the patient is transferred to a skilled rehab facility following release from the hospital, a list of the current medications will be sent to the facility for the patient to continue.  When discharged from the skilled rehab facility, please have the facility set up the patient's Home Health Physical Therapy prior to being released. Also, the skilled facility will be responsible for providing the patient with their medications at time of release from the facility to include their pain medication, the muscle relaxants, and their blood thinner medication. If the patient is still at the rehab facility at time of the two week follow up appointment, the skilled rehab facility will also need to assist the patient in arranging follow up appointment in our office and any transportation needs.  MAKE SURE YOU:  Understand these instructions.  Will watch your condition.  Will get help right away if you are not doing well or get worse.    Pick up stool softner and laxative for home use following surgery while on pain medications. Do not submerge incision under water. Please use good hand washing techniques while changing dressing each day. May shower starting three days after surgery. Please use a clean  towel to pat the incision dry following showers. Continue to use ice for pain and swelling after surgery. Do not use any lotions or creams on the incision until instructed by your surgeon.  Take a 325 mg enteric coated Aspirin twice a day for three weeks and then discontinue.  Postoperative Constipation Protocol  Constipation - defined medically as fewer than three stools per week and severe constipation as less than one stool per week.  One of the most common issues patients have following surgery is constipation.  Even if you have a regular bowel pattern at home, your normal regimen is likely to be disrupted due to multiple reasons following surgery.  Combination of anesthesia, postoperative narcotics, change in appetite and fluid intake all can affect your bowels.  In order to avoid complications following surgery, here are some recommendations in order to help you during your recovery period.  Colace (docusate) - Pick up an over-the-counter form of Colace or another stool softener and take twice a day as long as you are requiring postoperative  pain medications.  Take with a full glass of water daily.  If you experience loose stools or diarrhea, hold the colace until you stool forms back up.  If your symptoms do not get better within 1 week or if they get worse, check with your doctor.  Dulcolax (bisacodyl) - Pick up over-the-counter and take as directed by the product packaging as needed to assist with the movement of your bowels.  Take with a full glass of water.  Use this product as needed if not relieved by Colace only.   MiraLax (polyethylene glycol) - Pick up over-the-counter to have on hand.  MiraLax is a solution that will increase the amount of water in your bowels to assist with bowel movements.  Take as directed and can mix with a glass of water, juice, soda, coffee, or tea.  Take if you go more than two days without a movement. Do not use MiraLax more than once per day. Call your  doctor if you are still constipated or irregular after using this medication for 7 days in a row.  If you continue to have problems with postoperative constipation, please contact the office for further assistance and recommendations.  If you experience "the worst abdominal pain ever" or develop nausea or vomiting, please contact the office immediatly for further recommendations for treatment.

## 2014-12-10 NOTE — Op Note (Signed)
NAMMarland Kitchen:  Chip BoerNNIN, Kaytlynne               ACCOUNT NO.:  [id number]  MEDICAL RECORD NO.:  [id number]  LOCATION:  1605                         [facility]  PHYSICIAN:  Ollen GrossFrank Eisley Barber, M.D.    DATE OF BIRTH:  January 14, 1956  DATE OF PROCEDURE:  12/09/2014 DATE OF DISCHARGE:                              OPERATIVE REPORT   PREOPERATIVE DIAGNOSIS:  Arthrofibrosis, left knee.  POSTOPERATIVE DIAGNOSIS:  Arthrofibrosis, left knee.  PROCEDURE:  Left knee arthrotomy with scar excision.  SURGEON:  Ollen GrossFrank Roderick Sweezy, MD  ASSISTANT:  Alexzandrew L. Perkins, PA-C  ANESTHESIA:  General.  ESTIMATED BLOOD LOSS:  Minimal.  DRAINS:  Hemovac x1.  TOURNIQUET TIME:  21 minutes at 300 mmHg.  COMPLICATIONS:  None.  CONDITION:  Stable to recovery.  BRIEF CLINICAL NOTE:  Ms. Samantha Benton is a 58 year old female, had a left total knee arthroplasty performed in [place] 2 years ago.  She has had significant stiffness and inability to flex the knee beyond 60 or 70 degrees.  She came to me in second opinion and I felt that the only way to potentially improve her motion would be to do an arthrotomy and scar excision.  I felt it was too late to dangerous to do a closed manipulation as this would be a high risk of tendon rupture or fracture. We discussed procedure risks, potential complications, rehab course in detail and she elects to proceed with the open arthrotomy and scar excision.  She presents now for operation.  PROCEDURE IN DETAIL:  After successful administration of general anesthetic, tourniquet was placed high on the left thigh.  The left lower extremity prepped and draped in usual sterile fashion. Extremities wrapped in Esmarch, tourniquet inflated to 300 mmHg.  Range of motion of the spine is 70 degrees.  Skin cut with a 10 blade in the subcutaneous tissue.  Subcu flaps were created to decrease the adherence between the superficial fascia and the subcu.  The flaps created circumferentially.  I then  used a fresh blade to make a medial parapatellar arthrotomy.  Minimal fluid was encountered in the joint. No evidence of any inflamed appearing tissue.  The soft tissue on the proximal medial tibia subperiosteally elevated to the joint line with a knife and into the semimembranosus bursa with a Cobb elevator.  We then removed a large amount of scar tissue from under the extensor mechanism medially.  Recreated the medial gutter and the suprapatellar pouch as well as superomedially creating the pouch.  We then addressed laterally and removed a large portion of the infrapatellar fat pad which was completely scarred down to the tibia and mobilized the patella much better.  I also removed the scar tissue from under the lateral aspect of the extensor mechanism recreating the lateral pouch and the suprapatellar pouch laterally.  I then inspected the components, they were stable.  The flexion against gravity for the knee at that point was 100 degrees and I was able to flex an additional 15 to almost 115 degrees.  We then thoroughly irrigated the joint with saline.  The arthrotomy was closed over Hemovac drain with a running #1 V-Loc suture and also interrupted #1 PDS sutures.  Flexion against gravity  was 95.  I did not push much harder than that and did not want to stress the repair.  The subcu was then closed over a second limb of a Hemovac drain with interrupted 2-0 Vicryl.  Again, I was able to get her flexed to 95 against gravity.  The tourniquet released for a total time of 21 minutes.  Subcuticular was closed with running 4-0 Monocryl.  Incisions cleaned and dried.  Steri-Strips and a bulky sterile dressing applied. She was awakened and transported to recovery in stable condition.  Note, that a surgical assistant was a medical necessity for this procedure for retraction of vital ligaments and neurovascular structures while excising the scar tissue and for retracting to allowed access to  the deep recesses of pouches of the knee in a safe manner.     Ollen GrossFrank Raelyn Racette, M.D.     FA/MEDQ  D:  12/09/2014  T:  12/10/2014  Job:  [id number]

## 2014-12-10 NOTE — Progress Notes (Signed)
CARE MANAGEMENT NOTE 12/10/2014  Patient:  Chip BoerNNIN,Chaniece   Account Number:  [id number]  Date Initiated:  12/10/2014  Documentation initiated by:  Lanier ClamMAHABIR,Anastaisa Wooding  Subjective/Objective Assessment:   58 y/o f admitted w/l knee arthrotomy.     Action/Plan:   From home.   Anticipated DC Date:  12/10/2014   Anticipated DC Plan:  HOME W HOME HEALTH SERVICES      DC Planning Services  CM consult      Choice offered to / List presented to:  C-1 Patient   DME arranged  [facility]      DME agency  [facility]        Status of service:  Completed, signed off Medicare Important Message given?   (If response is "NO", the following Medicare IM given date fields will be blank) Date Medicare IM given:   Medicare IM given by:   Date Additional Medicare IM given:   Additional Medicare IM given by:    Discharge Disposition:    Per UR Regulation:  Reviewed for med. necessity/level of care/duration of stay  If discussed at Long Length of Stay Meetings, dates discussed:    Comments:  12/10/14 Lanier ClamKathy Terre Hanneman RN BSN NCM [phone] PT-HHPT.[facility].Noted patient already d/c but no HHPT order.Will f/u in am.

## 2014-12-10 NOTE — Progress Notes (Signed)
[facility]  [facility] the [facility]: [facility]  If patient discharges after hours, please call [phone].   Samantha HamperLecretia Benton 12/10/2014, 11:17 AM

## 2014-12-10 NOTE — Discharge Summary (Signed)
Physician Discharge Summary   Patient ID: Samantha Benton MRN: [id number] DOB/AGE: [birth year] 58 y.o.  Admit date: 12/09/2014 Discharge date: 12/10/2014  Primary Diagnosis:  Arthrofibrosis, left knee.  Admission Diagnoses:  Past Medical History  Diagnosis Date  . Anxiety   . Arthritis     right knee  . GERD (gastroesophageal reflux disease)   . Family history of adverse reaction to anesthesia     daughter woke up during surgery   Discharge Diagnoses:   Principal Problem:   Postoperative stiffness of total knee replacement Active Problems:   Arthrofibrosis of knee joint  Estimated body mass index is 30.27 kg/(m^2) as calculated from the following:   Height as of this encounter: 5' (1.524 m).   Weight as of this encounter: 70.308 kg (155 lb).  Procedure:  Procedure(s) (LRB): LEFT KNEE ARTHROTOMY WITH SCAR EXCISION (Left)   Consults: None  HPI: [name] is a 58 year old female, had a left total knee arthroplasty performed in [place] 2 years ago. She has had significant stiffness and inability to flex the knee beyond 60 or 70 degrees. She came to me in second opinion and I felt that the only way to potentially improve her motion would be to do an arthrotomy and scar excision. I felt it was too late to dangerous to do a closed manipulation as this would be a high risk of tendon rupture or fracture. We discussed procedure risks, potential complications, rehab course in detail and she elects to proceed with the open arthrotomy and scar excision. She presents now for operation.  Laboratory Data: Hospital Outpatient Visit on 12/07/2014  Component Date Value Ref Range Status  . WBC 12/07/2014 8.5  4.0 - 10.5 K/uL Final  . RBC 12/07/2014 4.09  3.87 - 5.11 MIL/uL Final  . Hemoglobin 12/07/2014 11.6* 12.0 - 15.0 g/dL Final  . HCT 40/98/1191 37.1  36.0 - 46.0 % Final  . MCV 12/07/2014 90.7  78.0 - 100.0 fL Final  . MCH 12/07/2014 28.4  26.0 - 34.0 pg Final  . MCHC  12/07/2014 31.3  30.0 - 36.0 g/dL Final  . RDW 47/82/9562 15.1  11.5 - 15.5 % Final  . Platelets 12/07/2014 275  150 - 400 K/uL Final  Office Visit on 11/02/2014  Component Date Value Ref Range Status  . WBC 11/02/2014 11.9* 4.0 - 10.5 K/uL Final  . RBC 11/02/2014 4.34  3.87 - 5.11 Mil/uL Final  . Platelets 11/02/2014 296.0  150.0 - 400.0 K/uL Final  . Hemoglobin 11/02/2014 12.0  12.0 - 15.0 g/dL Final  . HCT 13/07/6577 38.2  36.0 - 46.0 % Final  . MCV 11/02/2014 88.0  78.0 - 100.0 fl Final  . MCHC 11/02/2014 31.3  30.0 - 36.0 g/dL Final  . RDW 46/96/2952 16.0* 11.5 - 15.5 % Final  . Sodium 11/02/2014 140  135 - 145 mEq/L Final  . Potassium 11/02/2014 3.8  3.5 - 5.1 mEq/L Final  . Chloride 11/02/2014 103  96 - 112 mEq/L Final  . CO2 11/02/2014 26  19 - 32 mEq/L Final  . Glucose, Bld 11/02/2014 94  70 - 99 mg/dL Final  . BUN 84/13/2440 16  6 - 23 mg/dL Final  . Creatinine, Ser 11/02/2014 0.6  0.4 - 1.2 mg/dL Final  . Total Bilirubin 11/02/2014 0.2  0.2 - 1.2 mg/dL Final  . Alkaline Phosphatase 11/02/2014 93  39 - 117 U/L Final  . AST 11/02/2014 14  0 - 37 U/L Final  . ALT 11/02/2014 14  0 -  35 U/L Final  . Total Protein 11/02/2014 6.9  6.0 - 8.3 g/dL Final  . Albumin 16/10/960411/08/2014 3.2* 3.5 - 5.2 g/dL Final  . Calcium 54/09/811911/08/2014 9.4  8.4 - 10.5 mg/dL Final  . GFR 14/78/295611/08/2014 134.62  >60.00 mL/min Final  . INR 11/02/2014 1.0  0.8 - 1.0 ratio Final  . Prothrombin Time 11/02/2014 11.5  9.6 - 13.1 sec Final  . aPTT 11/02/2014 31.7  23.4 - 32.7 SEC Final     X-Rays:No results found.  EKG: Orders placed or performed in visit on 11/04/14  . EKG 12-Lead     Hospital Course: Samantha Benton is a 58 y.o. who was [facility]. They were brought to the operating room on 12/09/2014 and underwent Procedure(s): LEFT KNEE ARTHROTOMY WITH SCAR EXCISION.  Patient tolerated the procedure well and was later transferred to the recovery room and then to the orthopaedic floor for  postoperative care.  They were given PO and IV analgesics for pain control following their surgery.  They were given 24 hours of postoperative antibiotics of  Anti-infectives    Start     Dose/Rate Route Frequency Ordered Stop   12/09/14 1445  vancomycin (VANCOCIN) IVPB 1000 mg/200 mL premix     1,000 mg200 mL/hr over 60 Minutes Intravenous On call to O.R. 12/09/14 1431 12/09/14 1856     and started on DVT prophylaxis in the form of Lovenox.   PT and OT were ordered for total joint protocol.  Discharge planning consulted to help with postop disposition and equipment needs.  Patient had a tough night on the evening of surgery with some nausea and vomiting. Given antiemitics. They started to get up OOB with therapy on day one. Patient was seen in rounds and was ready to go home later that same day.   Diet: Regular diet Activity:WBAT Follow-up: on Dec 28th or 29th Disposition - Home Discharged Condition: good DVT Prophylaxis - Lovenox in hospital and then start Aspirin at home.      Discharge Instructions    Call MD / Call 911    Complete by:  As directed   If you experience chest pain or shortness of breath, CALL 911 and be transported to the hospital emergency room.  If you develope a fever above 101 F, pus (white drainage) or increased drainage or redness at the wound, or calf pain, call your surgeon's office.     Change dressing    Complete by:  As directed   Change dressing daily with sterile 4 x 4 inch gauze dressing and apply TED hose. Do not submerge the incision under water.     Constipation Prevention    Complete by:  As directed   Drink plenty of fluids.  Prune juice may be helpful.  You may use a stool softener, such as Colace (over the counter) 100 mg twice a day.  Use MiraLax (over the counter) for constipation as needed.     Diet - low sodium heart healthy    Complete by:  As directed      Discharge instructions    Complete by:  As directed   Pick up stool softner and  laxative for home use following surgery while on pain medications. Do not submerge incision under water. Please use good hand washing techniques while changing dressing each day. May shower starting three days after surgery. Please use a clean towel to pat the incision dry following showers. Continue to use ice for pain and swelling  after surgery. Do not use any lotions or creams on the incision until instructed by your surgeon.  Take a 325 mg enteric coated Aspirin twice a day for three weeks and then discontinue.  Postoperative Constipation Protocol  Constipation - defined medically as fewer than three stools per week and severe constipation as less than one stool per week.  One of the most common issues patients have following surgery is constipation.  Even if you have a regular bowel pattern at home, your normal regimen is likely to be disrupted due to multiple reasons following surgery.  Combination of anesthesia, postoperative narcotics, change in appetite and fluid intake all can affect your bowels.  In order to avoid complications following surgery, here are some recommendations in order to help you during your recovery period.  Colace (docusate) - Pick up an over-the-counter form of Colace or another stool softener and take twice a day as long as you are requiring postoperative pain medications.  Take with a full glass of water daily.  If you experience loose stools or diarrhea, hold the colace until you stool forms back up.  If your symptoms do not get better within 1 week or if they get worse, check with your doctor.  Dulcolax (bisacodyl) - Pick up over-the-counter and take as directed by the product packaging as needed to assist with the movement of your bowels.  Take with a full glass of water.  Use this product as needed if not relieved by Colace only.   MiraLax (polyethylene glycol) - Pick up over-the-counter to have on hand.  MiraLax is a solution that will increase the amount of  water in your bowels to assist with bowel movements.  Take as directed and can mix with a glass of water, juice, soda, coffee, or tea.  Take if you go more than two days without a movement. Do not use MiraLax more than once per day. Call your doctor if you are still constipated or irregular after using this medication for 7 days in a row.  If you continue to have problems with postoperative constipation, please contact the office for further assistance and recommendations.  If you experience "the worst abdominal pain ever" or develop nausea or vomiting, please contact the office immediatly for further recommendations for treatment.     Do not put a pillow under the knee. Place it under the heel.    Complete by:  As directed      Do not sit on low chairs, stoools or toilet seats, as it may be difficult to get up from low surfaces    Complete by:  As directed      Driving restrictions    Complete by:  As directed   No driving until released by the physician.     Increase activity slowly as tolerated    Complete by:  As directed      Lifting restrictions    Complete by:  As directed   No lifting until released by the physician.     Patient may shower    Complete by:  As directed   You may shower without a dressing once there is no drainage.  Do not wash over the wound.  If drainage remains, do not shower until drainage stops.     TED hose    Complete by:  As directed   Use stockings (TED hose) for 3 weeks on both leg(s).  You may remove them at night for sleeping.     Weight  bearing as tolerated    Complete by:  As directed             Medication List    STOP taking these medications        cholecalciferol 1000 UNITS tablet  Commonly known as:  VITAMIN D     ibuprofen 200 MG tablet  Commonly known as:  ADVIL,MOTRIN     meloxicam 7.5 MG tablet  Commonly known as:  MOBIC      TAKE these medications        methocarbamol 500 MG tablet  Commonly known as:  ROBAXIN  Take 1  tablet (500 mg total) by mouth every 6 (six) hours as needed for muscle spasms.     omeprazole 40 MG capsule  Commonly known as:  PRILOSEC  Take 40 mg by mouth every evening.     ondansetron 4 MG tablet  Commonly known as:  ZOFRAN  Take 1 tablet (4 mg total) by mouth every 6 (six) hours as needed for nausea.     oxyCODONE 5 MG immediate release tablet  Commonly known as:  Oxy IR/ROXICODONE  Take 1-2 tablets (5-10 mg total) by mouth every 3 (three) hours as needed for moderate pain, severe pain or breakthrough pain.     traMADol 50 MG tablet  Commonly known as:  ULTRAM  Take 1-2 tablets (50-100 mg total) by mouth every 6 (six) hours as needed (mild pain).       Follow-up Information    Follow up with Loanne DrillingALUISIO,FRANK V, MD. Schedule an appointment as soon as possible for a visit in 2 weeks.   Specialty:  Orthopedic Surgery   Why:  Call office at [phone] to set up appointment with Dr. Lequita HaltAluisio on Dec 28th or 29th.   Contact information:   [address] [place] [address] [phone]       [facility].   Why:  HHPT   Contact information:   [address] [place] [address] [phone]       Signed: Avel Peacerew Fran Mcree, PA-C Orthopaedic Surgery 01/05/2015, 9:39 AM

## 2014-12-10 NOTE — Progress Notes (Signed)
   Subjective: 1 Day Post-Op Procedure(s) (LRB): LEFT KNEE ARTHROTOMY WITH SCAR EXCISION (Left) Patient reports pain as mild.   Patient seen in rounds with Dr. Lequita HaltAluisio.  Pain is doing fairly well at this time but developed some nausea and vomiting on rounds.  Will give antiemetics and see how she does this morning.  If does well today and improves with therapy, then home later today after therapy. Patient is well, but has had some minor complaints of pain in the knee, requiring pain medications We will start therapy today.  Plan is to go Home after hospital stay if she improves.  Objective: Vital signs in last 24 hours: Temp:  [98 F (36.7 C)-98.9 F (37.2 C)] 98.9 F (37.2 C) (12/17 0441) Pulse Rate:  [70-92] 85 (12/17 0441) Resp:  [16-19] 18 (12/17 0441) BP: (113-148)/(59-93) 115/60 mmHg (12/17 0441) SpO2:  [95 %-100 %] 100 % (12/17 0441) Weight:  [70.308 kg (155 lb)] 70.308 kg (155 lb) (12/16 1431)  Intake/Output from previous day:  Intake/Output Summary (Last 24 hours) at 12/10/14 0843 Last data filed at 12/10/14 0735  Gross per 24 hour  Intake   1300 ml  Output    105 ml  Net   1195 ml    Intake/Output this shift: Total I/O In: -  Out: 30 [Drains:30]  Labs:  Recent Labs  12/07/14 1030  HGB 11.6*    Recent Labs  12/07/14 1030  WBC 8.5  RBC 4.09  HCT 37.1  PLT 275   No results for input(s): NA, K, CL, CO2, BUN, CREATININE, GLUCOSE, CALCIUM in the last 72 hours. No results for input(s): LABPT, INR in the last 72 hours.  EXAM General - Patient is Alert, Appropriate and Oriented Extremity - Neurovascular intact Sensation intact distally Dorsiflexion/Plantar flexion intact Dressing - dressing C/D/I Motor Function - intact, moving foot and toes well on exam.  Hemovac pulled without difficulty.  Past Medical History  Diagnosis Date  . Anxiety   . Arthritis     right knee  . GERD (gastroesophageal reflux disease)   . Family history of adverse reaction  to anesthesia     daughter woke up during surgery    Assessment/Plan: 1 Day Post-Op Procedure(s) (LRB): LEFT KNEE ARTHROTOMY WITH SCAR EXCISION (Left) Principal Problem:   Postoperative stiffness of total knee replacement Active Problems:   Arthrofibrosis of knee joint  Estimated body mass index is 30.27 kg/(m^2) as calculated from the following:   Height as of this encounter: 5' (1.524 m).   Weight as of this encounter: 70.308 kg (155 lb). Advance diet Up with therapy Discharge home with home health later today is he improves.  If not, probably home tomorrow.  DVT Prophylaxis - Lovenox in hospital and then start Aspirin at home. Weight-Bearing as tolerated to left leg D/C O2 and Pulse OX and try on Room Air  F/U in two weeks on Dec. 28th or 29th with Dr. Lequita HaltAluisio. Activity - WBAT to left leg. Disposition - Home with HHPT COD - pending at this time.  Avel Peacerew Saraya Tirey, PA-C Orthopaedic Surgery 12/10/2014, 8:43 AM

## 2014-12-11 NOTE — Care Management Note (Signed)
    Page 1 of 2   12/11/2014     2:17:00 PM CARE MANAGEMENT NOTE 12/11/2014  Patient:  Samantha Benton,Samantha Benton   Account Number:  [id number]  Date Initiated:  12/10/2014  Documentation initiated by:  Lanier ClamMAHABIR,Kindel Rochefort  Subjective/Objective Assessment:   58 y/o f admitted w/l knee arthrotomy.     Action/Plan:   From home.   Anticipated DC Date:  12/10/2014   Anticipated DC Plan:  HOME W HOME HEALTH SERVICES      DC Planning Services  CM consult      Choice offered to / List presented to:  C-1 Patient   DME arranged  BEDSIDE COMMODE      DME agency  [facility]     HH arranged  HH-2 PT      [facility]   Status of service:  Completed, signed off Medicare Important Message given?   (If response is "NO", the following Medicare IM given date fields will be blank) Date Medicare IM given:   Medicare IM given by:   Date Additional Medicare IM given:   Additional Medicare IM given by:    Discharge Disposition:  HOME W HOME HEALTH SERVICES  Per UR Regulation:  Reviewed for med. necessity/level of care/duration of stay  If discussed at Long Length of Stay Meetings, dates discussed:    Comments:  12/11/14 Lanier ClamKathy Stanislaw Acton RN BSN NCM [phone] [facility] office spoke to [place] to inform of HHPT order.Also spoke [place] [facility] [place] Tim aware of d/c yesterday & HHPT order today.  12/10/14 Lanier ClamKathy Sagan Maselli RN BSN NCM [phone] PT-HHPT.[facility] of [facility].Noted patient already d/c but no HHPT order.Will f/u in am.

## 2015-04-18 NOTE — Op Note (Signed)
PATIENT NAME:  Samantha Benton, Samantha Benton MR#:  [id number] DATE OF BIRTH:  07/31/1956  DATE OF PROCEDURE:  04/30/2012  PREOPERATIVE DIAGNOSIS: Severe osteoarthritis of the left knee.   POSTOPERATIVE DIAGNOSIS: Severe osteoarthritis of the left knee.   PROCEDURE PERFORMED: DePuy LCS cemented rotating platform total knee replacement (standard plus femur/patella, #3 keeled tibia, 15-mm rotating platform polyethylene insert).   SURGEON: Valinda HoarHoward E. Raelan Burgoon, M.D.   Threasa HeadsASSISTANErby Pian: C. Smith, M.D.   ANESTHESIA: Spinal plus femoral nerve block.   COMPLICATIONS: None.   DRAINS: Two Autovac.   ESTIMATED BLOOD LOSS: None.   REPLACED: None.   DESCRIPTION OF PROCEDURE: The patient was brought to the operating room where she underwent satisfactory spinal anesthesia. A femoral nerve block had been placed in the PAC-U. She was positioned and padded appropriately and the left leg was prepped and draped in sterile fashion. Esmarch was applied and the tourniquet was inflated to 350 mmHg. Tourniquet time was 96 minutes. An anterior midline incision was made and dissection carried out sharply through subcutaneous tissue. A medial arthrotomy was carried out and soft tissue debridement carried out. The proximal tibial cutting guide was inserted and pinned in place appropriately. The proximal tibial cut was made. The ligaments were examined and were felt to be well balanced. The femur was measured as a standard size. The anterior cutting block was positioned and a centering hole made. The rotation guide was inserted and along with a 5-mm shim was aligned appropriately. The anterior cutting block was pinned in place and the anterior and posterior cuts made. The distal femoral cutting block 4 degrees left was inserted and pinned in place. The distal cut was made. The finishing guide was inserted and the finishing cuts made.   The tibia was sized at a #3 and a centering hole made. The trial was inserted and along with a 15-mm plastic  insert the femur was then put in place and the knee articulated nicely. Range of motion was excellent and she was stable. The patella was cut and drilled and a trial inserted. This tracked well. The trials were all removed and the wound was thoroughly irrigated and dried while cement was mixed.  A #3 keeled tibial component was cemented in place with a 15-mm rotating platform poly and cemented femoral component. The standard patellar component was cemented in place. All excess cement was removed and the cement was allowed to harden in full extension for at least 10 minutes. Final debridement was carried out. Final irrigation was carried out. The tissues were infiltrated with 0.25% Marcaine with epinephrine, Toradol, and morphine. Autovacs were inserted. The capsule was closed with #2 Ortho cord. The subcutaneous tissue was closed with 2-0 Vicryl and the skin was closed with staples. TENS pads and dry sterile dressing were applied along with a Polar Care and knee immobilizer. Tourniquet was deflated with good return of blood flow to the foot. The Autovac was activated. The patient was transferred to her hospital bed and taken to recovery in good condition.     ____________________________ Valinda HoarHoward E. Saarah Dewing, MD hem:bjt D: 04/30/2012 10:23:06 ET T: 04/30/2012 11:51:42 ET JOB#: [id number]  cc: Valinda HoarHoward E. Sayward Horvath, MD, <Dictator> Valinda HoarHOWARD E Andie Mortimer MD ELECTRONICALLY SIGNED 05/01/2012 10:23

## 2015-04-18 NOTE — H&P (Signed)
    Subjective/Chief Complaint Left knee pain    History of Present Illness 59 year old female with progressive osteoarthritis of the left knee for several years.  She has had multiple injections, NSAIDs. bracing and rest without lessening of pain.  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 Requests surgery for relief.  Have discusse Total Knee Arthroplasty with the patient who wishes to proceed. Risks and benefits of surgery were discussed at length including but not limited to infection, non union, nerve or blood vessed damage, non union, need for repeat surgery, blood clots and lung emboli, and death.. Post op protocol discussed.  X-rays show advanced osteoarthritis with varus angulation and patellar spurring. Complete loss of joint space medially.   Past Med/Surgical Hx:  Carpal Tunnel Release:   Cholecystectomy:   Right knee:   ALLERGIES:  Penicillin: Anaphylaxis  HOME MEDICATIONS: Medication Instructions Status  Aleve 220 mg oral tablet 1 tab(s) orally every 8 hours as needed Active  ibuprofen 200 mg oral capsule cap(s) orally   two as needed Active   Family and Social History:   Family History Non-Contributory    Social History negative tobacco    Place of Living Home   Review of Systems:   Fever/Chills No    Cough No    Sputum No    Abdominal Pain No   Physical Exam:   GEN well developed, well nourished    HEENT pink conjunctivae    NECK supple    RESP normal resp effort    CARD regular rate    LYMPH negative neck    EXTR negative edema, Left knee--  medial joint line pain, no effusion, range of motion 0-100*,  circulation/sensation/motor function good.    SKIN normal to palpation    NEURO motor/sensory function intact    PSYCH alert, A+O to time, place, person     Assessment/Admission Diagnosis severe osteoarthritis left knee    Plan Left Total Knee Arthroplasty   Electronic  Signatures: Valinda HoarMiller, Nahiara Kretzschmar E (MD)  (Signed 06-May-13 17:41)  Authored: CHIEF COMPLAINT and HISTORY, PAST MEDICAL/SURGIAL HISTORY, ALLERGIES, HOME MEDICATIONS, FAMILY AND SOCIAL HISTORY, REVIEW OF SYSTEMS, PHYSICAL EXAM, ASSESSMENT AND PLAN   Last Updated: 06-May-13 17:41 by Valinda HoarMiller, Aaro Meyers E (MD)

## 2015-04-18 NOTE — Discharge Summary (Signed)
PATIENT NAME:  Samantha Benton, Samantha Benton MR#:  [id number] DATE OF BIRTH:  08-10-1956  DATE OF ADMISSION:  04/30/2012 DATE OF DISCHARGE:  05/04/2012  FINAL DIAGNOSIS:  Advanced osteoarthritis of the left knee.   PROCEDURE PERFORMED: Cemented DuPuy LCS rotating platform total knee replacement 04/30/2012.   COMPLICATIONS: None.   CONSULTATIONS: None.   DISCHARGE MEDICATIONS:  1. Enteric-coated aspirin one p.o. b.i.d. for six weeks.  2. Norco 5/325 q.6h. p.r.n. pain.  3. Iron 1 p.o. daily. 4. Neurontin 400 mg b.i.d.   HISTORY OF PRESENT ILLNESS: The patient is a 59 year old female with progressive osteoarthritis of the left knee for several years. She had multiple injections, NSAIDs bracing and rest without relief. 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 She requested surgical relief. The risks and benefits and postoperative protocol were discussed with the patient and her husband at length.   PAST MEDICAL HISTORY:  ILLNESSES: As above.   HOME MEDICATIONS: Aleve and ibuprofen.   OPERATIONS:  Carpal tunnel release cholecystectomy, right knee surgery.   ALLERGIES: Penicillin causes anaphylactic reaction.   FAMILY HISTORY: Unremarkable.   SOCIAL HISTORY: The patient does not smoke or drink. She lives at home. She is married and works.   REVIEW OF SYSTEMS: Unremarkable.   PHYSICAL EXAMINATION: Vital signs were normal. The patient was alert and cooperative and fully oriented. The left knee showed medial joint line pain with no real effusion. Motion was 0 to 100 degrees. Neurovascular status is good distally. The skin was intact. Ligaments are stable. Pain with movement.   HOSPITAL COURSE: On 04/30/2012 the patient underwent a left total knee replacement without difficulty. Postoperatively she did well and stable. She was gradually ambulated. Her hemoglobin dropped to 9.0 on the second postoperative day. She was stable and made good progress with therapy. Her  wound dressing was changed and wound was benign. She is discharged on 05/04/2012 to home with home physical therapy. She will be partial weight-bearing with leg. She is to be seen in my office in two weeks for exam.    ____________________________ Valinda HoarHoward E. Alter Moss, MD hem:ljs D: 05/06/2012 14:28:00 ET T: 05/07/2012 13:12:40 ET JOB#: [id number]  cc: Valinda HoarHoward E. Ramona Ruark, MD, <Dictator> Valinda HoarHOWARD E Jacinto Keil MD ELECTRONICALLY SIGNED 05/08/2012 15:19

## 2015-04-19 ENCOUNTER — Other Ambulatory Visit: Payer: Self-pay | Admitting: Family Medicine

## 2015-04-19 DIAGNOSIS — R110 Nausea: Secondary | ICD-10-CM

## 2015-04-19 DIAGNOSIS — R1032 Left lower quadrant pain: Secondary | ICD-10-CM

## 2015-04-22 ENCOUNTER — Other Ambulatory Visit: Payer: BC Managed Care – PPO

## 2015-05-04 ENCOUNTER — Ambulatory Visit: Payer: Self-pay | Admitting: Orthopedic Surgery

## 2015-05-04 NOTE — Progress Notes (Signed)
Drew---- please put in PRE OP ORDERS.  Has pst appt 05/07/15  Thanks

## 2015-05-05 NOTE — Patient Instructions (Addendum)
Samantha Benton  05/05/2015   Your procedure is scheduled on: 05-12-15  [facility] and follow signs to               Short Stay Center at 2:50 PM.  Call this number if you have problems the morning of surgery [phone]   Remember: ONLY 1 PERSON MAY GO WITH YOU TO SHORT STAY TO GET  READY MORNING OF YOUR SURGERY.  Do not eat food after midnight Tuesday night. May have clear liquids until 10:30 am day of surgery.  Nothing by mouth after 10:30 am.  Take these medicines the morning of surgery with A SIP OF WATER: None                               You may not have any metal on your body including hair pins and              piercings  Do not wear jewelry, make-up, lotions, powders or perfumes,deodorant.             Do not wear nail polish.  Do not shave  48 hours prior to surgery.              .   Do not bring valuables to the hospital. [facility] IS NOT             RESPONSIBLE   FOR VALUABLES.  Contacts, dentures or bridgework may not be worn into surgery.  Leave suitcase in the car. After surgery it may be brought to your room.     Patients discharged the day of surgery will not be allowed to drive home.  Name and phone number of your driver: Vernona RiegerJohn Mogan - husband - cell [phone]  Special Instructions: coughing and deep breathing exercises, leg exercises              Please read over the following fact sheets you were given: _____________________________________________________________________             [facility] - Preparing for Surgery Before surgery, you can play an important role.  Because skin is not sterile, your skin needs to be as free of germs as possible.  You can reduce the number of germs on your skin by washing with CHG (chlorahexidine gluconate) soap before surgery.  CHG is an antiseptic cleaner which kills germs and bonds with the skin to continue killing germs even after washing. Please DO NOT use if you have an  allergy to CHG or antibacterial soaps.  If your skin becomes reddened/irritated stop using the CHG and inform your nurse when you arrive at Short Stay. Do not shave (including legs and underarms) for at least 48 hours prior to the first CHG shower.  You may shave your face/neck. Please follow these instructions carefully:  1.  Shower with CHG Soap the night before surgery and the  morning of Surgery.  2.  If you choose to wash your hair, wash your hair first as usual with your  normal  shampoo.  3.  After you shampoo, rinse your hair and body thoroughly to remove the  shampoo.                           4.  Use CHG as you would any other  liquid soap.  You can apply chg directly  to the skin and wash                       Gently with a scrungie or clean washcloth.  5.  Apply the CHG Soap to your body ONLY FROM THE NECK DOWN.   Do not use on face/ open                           Wound or open sores. Avoid contact with eyes, ears mouth and genitals (private parts).                       Wash face,  Genitals (private parts) with your normal soap.             6.  Wash thoroughly, paying special attention to the area where your surgery  will be performed.  7.  Thoroughly rinse your body with warm water from the neck down.  8.  DO NOT shower/wash with your normal soap after using and rinsing off  the CHG Soap.                9.  Pat yourself dry with a clean towel.            10.  Wear clean pajamas.            11.  Place clean sheets on your bed the night of your first shower and do not  sleep with pets. Day of Surgery : Do not apply any lotions/deodorants the morning of surgery.  Please wear clean clothes to the hospital/surgery center.  FAILURE TO FOLLOW THESE INSTRUCTIONS MAY RESULT IN THE CANCELLATION OF YOUR SURGERY PATIENT SIGNATURE_________________________________  NURSE  SIGNATURE__________________________________  ________________________________________________________________________   Adam Phenix  An incentive spirometer is a tool that can help keep your lungs clear and active. This tool measures how well you are filling your lungs with each breath. Taking long deep breaths may help reverse or decrease the chance of developing breathing (pulmonary) problems (especially infection) following:  A long period of time when you are unable to move or be active. BEFORE THE PROCEDURE   If the spirometer includes an indicator to show your best effort, your nurse or respiratory therapist will set it to a desired goal.  If possible, sit up straight or lean slightly forward. Try not to slouch.  Hold the incentive spirometer in an upright position. INSTRUCTIONS FOR USE  1. Sit on the edge of your bed if possible, or sit up as far as you can in bed or on a chair. 2. Hold the incentive spirometer in an upright position. 3. Breathe out normally. 4. Place the mouthpiece in your mouth and seal your lips tightly around it. 5. Breathe in slowly and as deeply as possible, raising the piston or the ball toward the top of the column. 6. Hold your breath for 3-5 seconds or for as long as possible. Allow the piston or ball to fall to the bottom of the column. 7. Remove the mouthpiece from your mouth and breathe out normally. 8. Rest for a few seconds and repeat Steps 1 through 7 at least 10 times every 1-2 hours when you are awake. Take your time and take a few normal breaths between deep breaths. 9. The spirometer may include an indicator to show your best effort. Use the indicator as a goal  to work toward during each repetition. 10. After each set of 10 deep breaths, practice coughing to be sure your lungs are clear. If you have an incision (the cut made at the time of surgery), support your incision when coughing by placing a pillow or rolled up towels firmly  against it. Once you are able to get out of bed, walk around indoors and cough well. You may stop using the incentive spirometer when instructed by your caregiver.  RISKS AND COMPLICATIONS  Take your time so you do not get dizzy or light-headed.  If you are in pain, you may need to take or ask for pain medication before doing incentive spirometry. It is harder to take a deep breath if you are having pain. AFTER USE  Rest and breathe slowly and easily.  It can be helpful to keep track of a log of your progress. Your caregiver can provide you with a simple table to help with this. If you are using the spirometer at home, follow these instructions: Alpha IF:   You are having difficultly using the spirometer.  You have trouble using the spirometer as often as instructed.  Your pain medication is not giving enough relief while using the spirometer.  You develop fever of 100.5 F (38.1 C) or higher. SEEK IMMEDIATE MEDICAL CARE IF:   You cough up bloody sputum that had not been present before.  You develop fever of 102 F (38.9 C) or greater.  You develop worsening pain at or near the incision site. MAKE SURE YOU:   Understand these instructions.  Will watch your condition.  Will get help right away if you are not doing well or get worse. Document Released: 04/23/2007 Document Revised: 03/04/2012 Document Reviewed: 06/24/2007 ExitCare Patient Information 2014 ExitCare, [place].   ________________________________________________________________________    CLEAR LIQUID DIET   Foods Allowed                                                                     Foods Excluded  Coffee and tea, regular and decaf                             liquids that you cannot  Plain Jell-O in any flavor                                             see through such as: Fruit ices (not with fruit pulp)                                     milk, soups, orange juice  Iced Popsicles                                     All solid food Carbonated beverages, regular and diet  Cranberry, grape and apple juices Sports drinks like Gatorade Lightly seasoned clear broth or consume(fat free) Sugar, honey syrup  Sample Menu Breakfast                                Lunch                                     Supper Cranberry juice                    Beef broth                            Chicken broth Jell-O                                     Grape juice                           Apple juice Coffee or tea                        Jell-O                                      Popsicle                                                Coffee or tea                        Coffee or tea  _____________________________________________________________________

## 2015-05-05 NOTE — Progress Notes (Addendum)
EKG 11-02-2014 EPIC LOV 11-02-2014- Michele Penze, PA-C (cardio) Heart Cath 11-13-14 EPIC

## 2015-05-07 ENCOUNTER — Encounter (HOSPITAL_COMMUNITY)
Admission: RE | Admit: 2015-05-07 | Discharge: 2015-05-07 | Disposition: A | Discharge status: Home / Self Care | Payer: BC Managed Care – PPO | Source: Ambulatory Visit | Attending: Orthopedic Surgery | Admitting: Orthopedic Surgery

## 2015-05-07 ENCOUNTER — Encounter (HOSPITAL_COMMUNITY): Payer: Self-pay

## 2015-05-07 DIAGNOSIS — Z01818 Encounter for other preprocedural examination: Secondary | ICD-10-CM | POA: Diagnosis present

## 2015-05-07 DIAGNOSIS — M24661 Ankylosis, right knee: Secondary | ICD-10-CM | POA: Diagnosis not present

## 2015-05-07 HISTORY — DX: Anemia, unspecified: D64.9

## 2015-05-07 HISTORY — DX: Other specified postprocedural states: Z98.890

## 2015-05-07 HISTORY — DX: Nausea with vomiting, unspecified: R11.2

## 2015-05-07 LAB — CBC
HCT: 37.1 % (ref 36.0–46.0)
Hemoglobin: 11.4 g/dL — ABNORMAL LOW (ref 12.0–15.0)
MCH: 27.7 pg (ref 26.0–34.0)
MCHC: 30.7 g/dL (ref 30.0–36.0)
MCV: 90 fL (ref 78.0–100.0)
PLATELETS: 260 10*3/uL (ref 150–400)
RBC: 4.12 MIL/uL (ref 3.87–5.11)
RDW: 15.4 % (ref 11.5–15.5)
WBC: 7.4 10*3/uL (ref 4.0–10.5)

## 2015-05-11 DIAGNOSIS — M179 Osteoarthritis of knee, unspecified: Secondary | ICD-10-CM | POA: Diagnosis present

## 2015-05-11 DIAGNOSIS — M1710 Unilateral primary osteoarthritis, unspecified knee: Secondary | ICD-10-CM | POA: Diagnosis present

## 2015-05-11 NOTE — H&P (Signed)
CC- Samantha Benton is a 59 y.o. female who presents with left knee stiffness  HPI- . Knee Pain: Patient presents with stiffness involving the  left knee. Onset of the symptoms was several weeks ago. Inciting event: none known. Current symptoms include stiffness. Patient has had prior knee problems. Evaluation to date: PT evaluation recently developed stiffness after an arthrotomy and scar excision after having increased range of motion after that procedure.. Treatment to date: further exercise to attempt to increase range of motion..  Past Medical History  Diagnosis Date  . Arthritis     right knee  . GERD (gastroesophageal reflux disease)   . Family history of adverse reaction to anesthesia     daughter woke up during surgery  . PONV (postoperative nausea and vomiting)   . Anxiety     pt. denies at preop appt.  . Anemia     hx. of    Past Surgical History  Procedure Laterality Date  . Cholecystectomy  1970  . Carpal tunnel release  2008  . Knee arthroscopy  2002    right knee  . Tubal ligation    . Gallstone removal    . Left heart catheterization with coronary angiogram N/A 11/13/2014    Procedure: LEFT HEART CATHETERIZATION WITH CORONARY ANGIOGRAM;  Surgeon: Corky CraftsJayadeep S Varanasi, MD;  [facility];  Service: Cardiovascular;  Laterality: N/A;  . Knee arthrotomy Left 12/09/2014    Procedure: LEFT KNEE ARTHROTOMY WITH SCAR EXCISION;  Surgeon: Loanne DrillingFrank Ziad Maye V, MD;  Location: [facility] ORS;  Service: Orthopedics;  Laterality: Left;    Prior to Admission medications   Medication Sig Start Date End Date Taking? Authorizing Provider  meloxicam (MOBIC) 15 MG tablet Take 15 mg by mouth daily as needed for pain.   Yes Historical Provider, MD  naproxen sodium (ANAPROX) 220 MG tablet Take 220 mg by mouth every morning.   Yes Historical Provider, MD  Vitamin D, Ergocalciferol, (DRISDOL) 50000 UNITS CAPS capsule Take 50,000 Units by mouth every 30 (thirty) days.   Yes Historical Provider,  MD  methocarbamol (ROBAXIN) 500 MG tablet Take 1 tablet (500 mg total) by mouth every 6 (six) hours as needed for muscle spasms. Patient not taking: Reported on 05/06/2015 12/10/14   Avel Peacerew Perkins, PA-C  ondansetron (ZOFRAN) 4 MG tablet Take 1 tablet (4 mg total) by mouth every 6 (six) hours as needed for nausea. Patient not taking: Reported on 05/06/2015 12/10/14   Avel Peacerew Perkins, PA-C  oxyCODONE (OXY IR/ROXICODONE) 5 MG immediate release tablet Take 1-2 tablets (5-10 mg total) by mouth every 3 (three) hours as needed for moderate pain, severe pain or breakthrough pain. Patient not taking: Reported on 05/06/2015 12/10/14   Avel Peacerew Perkins, PA-C  traMADol (ULTRAM) 50 MG tablet Take 1-2 tablets (50-100 mg total) by mouth every 6 (six) hours as needed (mild pain). Patient not taking: Reported on 05/06/2015 12/10/14   Avel Peacerew Perkins, PA-C   KNEE EXAM antalgic gait, effusion, ROM 0-85 degrees  Physical Examination: General appearance - alert, well appearing, and in no distress Mental status - alert, oriented to person, place, and time Chest - clear to auscultation, no wheezes, rales or rhonchi, symmetric air entry Heart - normal rate, regular rhythm, normal S1, S2, no murmurs, rubs, clicks or gallops Abdomen - soft, nontender, nondistended, no masses or organomegaly Neurological - alert, oriented, normal speech, no focal findings or movement disorder noted   Asessment/Plan--- Left knee arthrofibrosis- - Plan left knee closed manipulation. Procedure risks and potential comps  discussed with patient who elects to proceed. Goals are decreased pain and increased function with a high likelihood of achieving both

## 2015-05-12 ENCOUNTER — Encounter (HOSPITAL_COMMUNITY)
Admission: RE | Disposition: A | Discharge status: Home / Self Care | Payer: Self-pay | Source: Ambulatory Visit | Attending: Orthopedic Surgery

## 2015-05-12 ENCOUNTER — Encounter (HOSPITAL_COMMUNITY): Payer: Self-pay | Admitting: *Deleted

## 2015-05-12 ENCOUNTER — Ambulatory Visit (HOSPITAL_COMMUNITY): Payer: BC Managed Care – PPO | Admitting: Certified Registered Nurse Anesthetist

## 2015-05-12 ENCOUNTER — Ambulatory Visit (HOSPITAL_COMMUNITY)
Admission: RE | Admit: 2015-05-12 | Discharge: 2015-05-12 | Disposition: A | Discharge status: Home / Self Care | Payer: BC Managed Care – PPO | Source: Ambulatory Visit | Attending: Orthopedic Surgery | Admitting: Orthopedic Surgery

## 2015-05-12 DIAGNOSIS — Z791 Long term (current) use of non-steroidal anti-inflammatories (NSAID): Secondary | ICD-10-CM | POA: Diagnosis not present

## 2015-05-12 DIAGNOSIS — M24662 Ankylosis, left knee: Secondary | ICD-10-CM | POA: Insufficient documentation

## 2015-05-12 DIAGNOSIS — M13861 Other specified arthritis, right knee: Secondary | ICD-10-CM | POA: Insufficient documentation

## 2015-05-12 DIAGNOSIS — K219 Gastro-esophageal reflux disease without esophagitis: Secondary | ICD-10-CM | POA: Diagnosis not present

## 2015-05-12 DIAGNOSIS — M1710 Unilateral primary osteoarthritis, unspecified knee: Secondary | ICD-10-CM | POA: Diagnosis present

## 2015-05-12 DIAGNOSIS — E669 Obesity, unspecified: Secondary | ICD-10-CM | POA: Diagnosis not present

## 2015-05-12 DIAGNOSIS — Z79899 Other long term (current) drug therapy: Secondary | ICD-10-CM | POA: Diagnosis not present

## 2015-05-12 DIAGNOSIS — Z6830 Body mass index (BMI) 30.0-30.9, adult: Secondary | ICD-10-CM | POA: Diagnosis not present

## 2015-05-12 DIAGNOSIS — M179 Osteoarthritis of knee, unspecified: Secondary | ICD-10-CM | POA: Diagnosis present

## 2015-05-12 HISTORY — PX: KNEE CLOSED REDUCTION: SHX995

## 2015-05-12 SURGERY — MANIPULATION, KNEE, CLOSED
Anesthesia: General | Site: Knee | Laterality: Left

## 2015-05-12 MED ORDER — LACTATED RINGERS IV SOLN: INTRAVENOUS | Status: DC

## 2015-05-12 MED ORDER — FENTANYL CITRATE (PF) 100 MCG/2ML IJ SOLN: 25.0000 ug | INTRAMUSCULAR | Status: DC | PRN

## 2015-05-12 MED ORDER — LACTATED RINGERS IV SOLN
INTRAVENOUS | Status: DC
  Administered 2015-05-12: 1000 mL via INTRAVENOUS

## 2015-05-12 MED ORDER — MEPERIDINE HCL 50 MG/ML IJ SOLN: 6.2500 mg | INTRAMUSCULAR | Status: DC | PRN

## 2015-05-12 MED ORDER — HYDROCODONE-ACETAMINOPHEN 5-325 MG PO TABS
1.0000 | ORAL_TABLET | Freq: Four times a day (QID) | ORAL | Status: DC | PRN
  Administered 2015-05-12: 1 via ORAL
  Filled 2015-05-12: qty 1

## 2015-05-12 MED ORDER — PROPOFOL 10 MG/ML IV BOLUS
INTRAVENOUS | Status: AC
  Filled 2015-05-12: qty 20

## 2015-05-12 MED ORDER — LIDOCAINE HCL (CARDIAC) 20 MG/ML IV SOLN
INTRAVENOUS | Status: DC | PRN
  Administered 2015-05-12: 50 mg via INTRAVENOUS

## 2015-05-12 MED ORDER — PROMETHAZINE HCL 25 MG/ML IJ SOLN: 6.2500 mg | INTRAMUSCULAR | Status: DC | PRN

## 2015-05-12 MED ORDER — DEXAMETHASONE SODIUM PHOSPHATE 10 MG/ML IJ SOLN
10.0000 mg | Freq: Once | INTRAMUSCULAR | Status: AC
  Administered 2015-05-12: 10 mg via INTRAVENOUS

## 2015-05-12 MED ORDER — SODIUM CHLORIDE 0.9 % IV SOLN: INTRAVENOUS | Status: DC

## 2015-05-12 MED ORDER — PROPOFOL 10 MG/ML IV BOLUS
INTRAVENOUS | Status: DC | PRN
  Administered 2015-05-12: 120 mg via INTRAVENOUS

## 2015-05-12 MED ORDER — FENTANYL CITRATE (PF) 100 MCG/2ML IJ SOLN
INTRAMUSCULAR | Status: DC | PRN
  Administered 2015-05-12 (×2): 25 ug via INTRAVENOUS

## 2015-05-12 MED ORDER — ONDANSETRON HCL 4 MG/2ML IJ SOLN
INTRAMUSCULAR | Status: DC | PRN
  Administered 2015-05-12: 4 mg via INTRAVENOUS

## 2015-05-12 MED ORDER — HYDROCODONE-ACETAMINOPHEN 5-325 MG PO TABS: 1.0000 | ORAL_TABLET | Freq: Four times a day (QID) | ORAL | Status: DC | PRN

## 2015-05-12 MED ORDER — MIDAZOLAM HCL 5 MG/5ML IJ SOLN
INTRAMUSCULAR | Status: DC | PRN
  Administered 2015-05-12: 2 mg via INTRAVENOUS

## 2015-05-12 MED ORDER — MIDAZOLAM HCL 2 MG/2ML IJ SOLN
INTRAMUSCULAR | Status: AC
  Filled 2015-05-12: qty 2

## 2015-05-12 MED ORDER — FENTANYL CITRATE (PF) 100 MCG/2ML IJ SOLN
INTRAMUSCULAR | Status: AC
  Filled 2015-05-12: qty 2

## 2015-05-12 MED ORDER — ACETAMINOPHEN 10 MG/ML IV SOLN
1000.0000 mg | Freq: Once | INTRAVENOUS | Status: AC
  Administered 2015-05-12: 1000 mg via INTRAVENOUS
  Filled 2015-05-12: qty 100

## 2015-05-12 SURGICAL SUPPLY — 9 items
BANDAGE ADH SHEER 1  50/CT (GAUZE/BANDAGES/DRESSINGS) IMPLANT
GAUZE SPONGE 4X4 12PLY STRL (GAUZE/BANDAGES/DRESSINGS) IMPLANT
GLOVE BIO SURGEON STRL SZ8 (GLOVE) ×1 IMPLANT
GLOVE BIOGEL PI IND STRL 8 (GLOVE) ×1 IMPLANT
GLOVE BIOGEL PI INDICATOR 8 (GLOVE)
NDL SAFETY ECLIPSE 18X1.5 (NEEDLE) IMPLANT
NEEDLE HYPO 18GX1.5 SHARP (NEEDLE)
SWABSTICK PVP IODINE (MISCELLANEOUS) ×1 IMPLANT
SYR CONTROL 10ML LL (SYRINGE) IMPLANT

## 2015-05-12 NOTE — Anesthesia Postprocedure Evaluation (Signed)
  Anesthesia Post-op Note  Patient: Samantha Benton  Procedure(s) Performed: Procedure(s) (LRB): LEFT KNEE CLOSED MANIPULATION  (Left)  Patient Location: PACU  Anesthesia Type: General  Level of Consciousness: awake and alert   Airway and Oxygen Therapy: Patient Spontanous Breathing  Post-op Pain: mild  Post-op Assessment: Post-op Vital signs reviewed, Patient's Cardiovascular Status Stable, Respiratory Function Stable, Patent Airway and No signs of Nausea or vomiting  Last Vitals:  Filed Vitals:   05/12/15 1815  BP: 136/72  Pulse: 94  Temp:   Resp: 20    Post-op Vital Signs: stable   Complications: No apparent anesthesia complications

## 2015-05-12 NOTE — Anesthesia Procedure Notes (Signed)
Performed by: Paris LoreBLANTON, Rino Hosea M Patient Re-evaluated:Patient Re-evaluated prior to inductionOxygen Delivery Method: Circle system utilized Preoxygenation: Pre-oxygenation with 100% oxygen Intubation Type: IV induction Ventilation: Mask ventilation without difficulty Comments: Mask airway without difficulty. .Marland Kitchen

## 2015-05-12 NOTE — Anesthesia Preprocedure Evaluation (Signed)
Anesthesia Evaluation  Patient identified by MRN, date of birth, ID band Patient awake    Reviewed: Allergy & Precautions, H&P , NPO status , Patient's Chart, lab work & pertinent test results  History of Anesthesia Complications (+) PONV  Airway Mallampati: II  TM Distance: >3 FB Neck ROM: Full    Dental no notable dental hx.    Pulmonary shortness of breath,  breath sounds clear to auscultation  Pulmonary exam normal       Cardiovascular Exercise Tolerance: Good negative cardio ROS Normal cardiovascular examRhythm:Regular Rate:Normal  ECG: NSR, prolonged QT   Neuro/Psych Anxiety negative neurological ROS     GI/Hepatic negative GI ROS, Neg liver ROS, GERD-  Medicated and Controlled,  Endo/Other  negative endocrine ROS  Renal/GU negative Renal ROS  negative genitourinary   Musculoskeletal negative musculoskeletal ROS (+) Arthritis -,   Abdominal (+) + obese,   Peds negative pediatric ROS (+)  Hematology negative hematology ROS (+)   Anesthesia Other Findings   Reproductive/Obstetrics negative OB ROS                             Anesthesia Physical  Anesthesia Plan  ASA: II  Anesthesia Plan: General   Post-op Pain Management:    Induction: Intravenous  Airway Management Planned: LMA and Mask  Additional Equipment:   Intra-op Plan:   Post-operative Plan: Extubation in OR  Informed Consent: I have reviewed the patients History and Physical, chart, labs and discussed the procedure including the risks, benefits and alternatives for the proposed anesthesia with the patient or authorized representative who has indicated his/her understanding and acceptance.   Dental advisory given  Plan Discussed with: CRNA  Anesthesia Plan Comments: (Discussed spinal and general. She prefers general.)        Anesthesia Quick Evaluation

## 2015-05-12 NOTE — Op Note (Signed)
  OPERATIVE REPORT   PREOPERATIVE DIAGNOSIS: Arthrofibrosis, Left  knee.   POSTOPERATIVE DIAGNOSIS: Arthrofibrosis, Left knee.   PROCEDURE:  Left  knee closed manipulation.   SURGEON: Ollen GrossFrank Ashaya Raftery, MD   ASSISTANT: None.   ANESTHESIA: General.   COMPLICATIONS: None.   CONDITION: Stable to Recovery.   Pre-manipulation range of motion is 0-80.  Post-manipulation range of  Motion is 0-115  PROCEDURE IN DETAIL: After successful administration of general  anesthetic, exam under anesthesia was performed showing range of motion  0-80 degrees. I then placed my chest against the proximal tibia,  flexing the knee with audible lysis of adhesions. I was easily able to  get the knee flexed to 115  degrees. I then put the knee back in extension and with some  patellar manipulation and gentle pressure got to full  Extension.The patient was subsequently awakened and transported to Recovery in  stable condition.

## 2015-05-12 NOTE — Discharge Instructions (Signed)
Resume your physical therapy tomorrow  You may resume activity as tolerated     General Anesthesia, Care After Refer to this sheet in the next few weeks. These instructions provide you with information on caring for yourself after your procedure. Your health care provider may also give you more specific instructions. Your treatment has been planned according to current medical practices, but problems sometimes occur. Call your health care provider if you have any problems or questions after your procedure. WHAT TO EXPECT AFTER THE PROCEDURE After the procedure, it is typical to experience:  Sleepiness.  Nausea and vomiting. HOME CARE INSTRUCTIONS  For the first 24 hours after general anesthesia:  Have a responsible person with you.  Do not drive a car. If you are alone, do not take public transportation.  Do not drink alcohol.  Do not take medicine that has not been prescribed by your health care provider.  Do not sign important papers or make important decisions.  You may resume a normal diet and activities as directed by your health care provider.  Change bandages (dressings) as directed.  If you have questions or problems that seem related to general anesthesia, call the hospital and ask for the anesthetist or anesthesiologist on call. SEEK MEDICAL CARE IF:  You have nausea and vomiting that continue the day after anesthesia.  You develop a rash. SEEK IMMEDIATE MEDICAL CARE IF:   You have difficulty breathing.  You have chest pain.  You have any allergic problems. Document Released: 03/19/2001 Document Revised: 12/16/2013 Document Reviewed: 06/26/2013 [facility] Patient Information 2015 [place]. This information is not intended to replace advice given to you by your health care provider. Make sure you discuss any questions you have with your health care provider.

## 2015-05-12 NOTE — Transfer of Care (Signed)
Immediate Anesthesia Transfer of Care Note  Patient: Samantha Benton  Procedure(s) Performed: Procedure(s): LEFT KNEE CLOSED MANIPULATION  (Left)  Patient Location: PACU  Anesthesia Type:General  Level of Consciousness:  sedated, patient cooperative and responds to stimulation  Airway & Oxygen Therapy:Patient Spontanous Breathing and Patient connected to face mask oxgen  Post-op Assessment:  Report given to PACU RN and Post -op Vital signs reviewed and stable  Post vital signs:  Reviewed and stable  Last Vitals:  Filed Vitals:   05/12/15 1805  BP: 126/59  Pulse: 65  Temp: 36.6 C  Resp:     Complications: No apparent anesthesia complications

## 2015-05-13 ENCOUNTER — Encounter (HOSPITAL_COMMUNITY): Payer: Self-pay | Admitting: Orthopedic Surgery

## 2015-11-22 ENCOUNTER — Other Ambulatory Visit: Payer: Self-pay

## 2015-11-22 DIAGNOSIS — Z1231 Encounter for screening mammogram for malignant neoplasm of breast: Secondary | ICD-10-CM

## 2015-12-13 ENCOUNTER — Ambulatory Visit
Admission: RE | Admit: 2015-12-13 | Discharge: 2015-12-13 | Disposition: A | Discharge status: Home / Self Care | Payer: BC Managed Care – PPO | Source: Ambulatory Visit

## 2015-12-13 DIAGNOSIS — Z1231 Encounter for screening mammogram for malignant neoplasm of breast: Secondary | ICD-10-CM

## 2016-04-05 ENCOUNTER — Other Ambulatory Visit (HOSPITAL_COMMUNITY)
Admission: RE | Admit: 2016-04-05 | Discharge: 2016-04-05 | Disposition: A | Discharge status: Home / Self Care | Payer: BC Managed Care – PPO | Source: Ambulatory Visit | Attending: Family Medicine | Admitting: Family Medicine

## 2016-04-05 ENCOUNTER — Other Ambulatory Visit: Payer: Self-pay | Admitting: Family Medicine

## 2016-04-05 DIAGNOSIS — Z01419 Encounter for gynecological examination (general) (routine) without abnormal findings: Secondary | ICD-10-CM | POA: Insufficient documentation

## 2016-04-06 LAB — CYTOLOGY - PAP

## 2017-01-01 ENCOUNTER — Ambulatory Visit
Admission: RE | Admit: 2017-01-01 | Discharge: 2017-01-01 | Disposition: A | Discharge status: Home / Self Care | Payer: BC Managed Care – PPO | Source: Ambulatory Visit | Attending: Family Medicine | Admitting: Family Medicine

## 2017-01-01 ENCOUNTER — Other Ambulatory Visit: Payer: Self-pay | Admitting: Family Medicine

## 2017-01-01 DIAGNOSIS — R059 Cough, unspecified: Secondary | ICD-10-CM

## 2017-01-01 DIAGNOSIS — R05 Cough: Secondary | ICD-10-CM

## 2017-05-30 ENCOUNTER — Encounter (HOSPITAL_COMMUNITY): Payer: Self-pay | Admitting: *Deleted

## 2017-05-30 NOTE — Progress Notes (Signed)
Preop on 06/01/2017.  Need orders in epicl Thanks.

## 2017-05-30 NOTE — Progress Notes (Signed)
01-01-17 (EPIC) CXR

## 2017-05-30 NOTE — Patient Instructions (Addendum)
Samantha Benton  05/30/2017   Your procedure is scheduled on: 06-04-17  Report to [facility] Main Entrance Report to Admitting at 9:05 AM   Call this number if you have problems the morning of surgery [phone]   Remember: ONLY 1 PERSON MAY GO WITH YOU TO SHORT STAY TO GET  READY MORNING OF YOUR SURGERY.  Do not eat food or drink liquids :After Midnight.     Take these medicines the morning of surgery with A SIP OF WATER: None                               You may not have any metal on your body including hair pins and              piercings  Do not wear jewelry, make-up, lotions, powders or perfumes, deodorant             Do not wear nail polish.  Do not shave  48 hours prior to surgery.                Do not bring valuables to the hospital. [facility] IS NOT             RESPONSIBLE   FOR VALUABLES.  Contacts, dentures or bridgework may not be worn into surgery.  Leave suitcase in the car. After surgery it may be brought to your room.                         Please read over the following fact sheets you were given: _____________________________________________________________________             [facility] - Preparing for Surgery Before surgery, you can play an important role.  Because skin is not sterile, your skin needs to be as free of germs as possible.  You can reduce the number of germs on your skin by washing with CHG (chlorahexidine gluconate) soap before surgery.  CHG is an antiseptic cleaner which kills germs and bonds with the skin to continue killing germs even after washing. Please DO NOT use if you have an allergy to CHG or antibacterial soaps.  If your skin becomes reddened/irritated stop using the CHG and inform your nurse when you arrive at Short Stay. Do not shave (including legs and underarms) for at least 48 hours prior to the first CHG shower.  You may shave your face/neck. Please follow these instructions carefully:  1.  Shower  with CHG Soap the night before surgery and the  morning of Surgery.  2.  If you choose to wash your hair, wash your hair first as usual with your  normal  shampoo.  3.  After you shampoo, rinse your hair and body thoroughly to remove the  shampoo.                           4.  Use CHG as you would any other liquid soap.  You can apply chg directly  to the skin and wash                       Gently with a scrungie or clean washcloth.  5.  Apply the CHG Soap to your body ONLY FROM THE NECK DOWN.  Do not use on face/ open                           Wound or open sores. Avoid contact with eyes, ears mouth and genitals (private parts).                       Wash face,  Genitals (private parts) with your normal soap.             6.  Wash thoroughly, paying special attention to the area where your surgery  will be performed.  7.  Thoroughly rinse your body with warm water from the neck down.  8.  DO NOT shower/wash with your normal soap after using and rinsing off  the CHG Soap.                9.  Pat yourself dry with a clean towel.            10.  Wear clean pajamas.            11.  Place clean sheets on your bed the night of your first shower and do not  sleep with pets. Day of Surgery : Do not apply any lotions/deodorants the morning of surgery.  Please wear clean clothes to the hospital/surgery center.  FAILURE TO FOLLOW THESE INSTRUCTIONS MAY RESULT IN THE CANCELLATION OF YOUR SURGERY PATIENT SIGNATURE_________________________________  NURSE SIGNATURE__________________________________  ________________________________________________________________________  A

## 2017-06-01 ENCOUNTER — Encounter (HOSPITAL_COMMUNITY)
Admission: RE | Admit: 2017-06-01 | Discharge: 2017-06-01 | Disposition: A | Discharge status: Home / Self Care | Payer: BC Managed Care – PPO | Source: Ambulatory Visit | Attending: Orthopedic Surgery | Admitting: Orthopedic Surgery

## 2017-06-01 ENCOUNTER — Encounter (HOSPITAL_COMMUNITY): Payer: Self-pay

## 2017-06-01 ENCOUNTER — Ambulatory Visit: Payer: Self-pay | Admitting: Orthopedic Surgery

## 2017-06-01 LAB — APTT: aPTT: 33 seconds (ref 24–36)

## 2017-06-01 LAB — CBC
HCT: 36.2 % (ref 36.0–46.0)
HEMOGLOBIN: 11.5 g/dL — AB (ref 12.0–15.0)
MCH: 28.6 pg (ref 26.0–34.0)
MCHC: 31.8 g/dL (ref 30.0–36.0)
MCV: 90 fL (ref 78.0–100.0)
PLATELETS: 312 10*3/uL (ref 150–400)
RBC: 4.02 MIL/uL (ref 3.87–5.11)
RDW: 15.1 % (ref 11.5–15.5)
WBC: 8 10*3/uL (ref 4.0–10.5)

## 2017-06-01 LAB — COMPREHENSIVE METABOLIC PANEL
ALK PHOS: 91 U/L (ref 38–126)
ALT: 14 U/L (ref 14–54)
AST: 16 U/L (ref 15–41)
Albumin: 3.5 g/dL (ref 3.5–5.0)
Anion gap: 6 (ref 5–15)
BUN: 20 mg/dL (ref 6–20)
CHLORIDE: 108 mmol/L (ref 101–111)
CO2: 28 mmol/L (ref 22–32)
CREATININE: 0.6 mg/dL (ref 0.44–1.00)
Calcium: 9.3 mg/dL (ref 8.9–10.3)
Glucose, Bld: 101 mg/dL — ABNORMAL HIGH (ref 65–99)
Potassium: 4.2 mmol/L (ref 3.5–5.1)
SODIUM: 142 mmol/L (ref 135–145)
Total Bilirubin: 0.2 mg/dL — ABNORMAL LOW (ref 0.3–1.2)
Total Protein: 6.9 g/dL (ref 6.5–8.1)

## 2017-06-01 LAB — PROTIME-INR
INR: 0.98
Prothrombin Time: 13 seconds (ref 11.4–15.2)

## 2017-06-01 LAB — SURGICAL PCR SCREEN
MRSA, PCR: NEGATIVE
STAPHYLOCOCCUS AUREUS: NEGATIVE

## 2017-06-01 NOTE — H&P (Signed)
Samantha BeamDebora V Benton DOB: Jan 31, 1956 Married / Language: English / Race: Black or African American Female Date of Admission:  06/04/2017 CC:  Right Knee Pain History of Present Illness The patient is a 61 year old female who comes in for a preoperative History and Physical. The patient is scheduled for a right total knee arthroplasty to be performed by Dr. Gus RankinFrank V. Aluisio, MD at [facility] on 06-04-2017. The patient is a 61 year old female who presented for follow up of their knee. The patient is being followed for their right knee pain and osteoarthritis. They are now month(s) out from Euflexxa. Symptoms reported include: pain, pain after sitting, swelling, pain with weightbearing and difficulty ambulating. The patient feels that they are doing poorly and report their pain level to be moderate to severe (constant pain). The following medication has been used for pain control: antiinflammatory medication (BC powder). The patient has not gotten any relief of their symptoms with Cortisone injections or viscosupplementation. Unfortunately, her right knee is getting progressively worse. The injections have not helped at all. It is limiting what she can and cannot do. She is at a stage now where she is ready to go ahead and contemplate getting it fixed. She did have problems when she had her left knee done, had a lot of stiffness. She would like to go ahead and proceed with surgery at this time. They have been treated conservatively in the past for the above stated problem and despite conservative measures, they continue to have progressive pain and severe functional limitations and dysfunction. They have failed non-operative management including home exercise, medications, and injections. It is felt that they would benefit from undergoing total joint replacement. Risks and benefits of the procedure have been discussed with the patient and they elect to proceed with surgery. There are no active  contraindications to surgery such as ongoing infection or rapidly progressive neurological disease.  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 TraMADol HCl *ANALGESICS - OPIOID*  Nausea, Vomiting.  Family History First Degree Relatives  reported Liver Disease, Chronic  child Father  Deceased, Myocardial infarction. age 61 Mother  Deceased. age 61 Children  Daughter - Liver Disorder  Social History  Number of flights of stairs before winded  less than 1 Not under pain contract  Marital status  married Never consumed alcohol  09/03/2014: Never consumed alcohol Living situation  live with spouse No history of drug/alcohol rehab  Tobacco / smoke exposure  09/03/2014: no Tobacco use  Never smoker. 09/03/2014 Children  2 Exercise  Exercises rarely Current work status  working full time  Medication History BCAD 1 (Oral) Active. Vitamin D (Ergocalciferol) (1 Oral weekly) Specific strength unknown - Active. Mobic (15MG  Tablet, Oral) Active. Omeprazole (40MG  Capsule DR, Oral) Active. Aleve (220MG  Capsule, 1 (one) Oral) Active.  Past Surgical History Carpal Tunnel Repair  right Total Knee Replacement  left Gallbladder Surgery  open  Review of Systems General Not Present- Chills, Fatigue, Fever, Memory Loss, Night Sweats, Weight Gain and Weight Loss. Skin Not Present- Eczema, Hives, Itching, Lesions and Rash. HEENT Not Present- Dentures, Double Vision, Headache, Hearing Loss, Tinnitus and Visual Loss. Respiratory Not Present- Allergies, Chronic Cough, Coughing up blood, Shortness  of breath at rest and  Shortness of breath with exertion. Cardiovascular Not Present- Chest Pain, Difficulty Breathing Lying Down, Murmur, Palpitations, Racing/skipping heartbeats and Swelling. Gastrointestinal Not Present- Abdominal Pain, Bloody Stool, Constipation, Diarrhea, Difficulty Swallowing, Heartburn, Jaundice, Loss of appetitie, Nausea and Vomiting. Female Genitourinary Not Present- Blood in Urine, Discharge, Flank Pain, Incontinence, Painful Urination, Urgency, Urinary frequency, Urinary Retention, Urinating at Night and Weak urinary stream. Musculoskeletal Present- Joint Pain. Not Present- Back Pain, Joint Swelling, Morning Stiffness, Muscle Pain, Muscle Weakness and Spasms. Neurological Not Present- Blackout spells, Difficulty with balance, Dizziness, Paralysis, Tremor and Weakness. Psychiatric Not Present- Insomnia.  Vitals  Weight: 150 lb Height: 60in Weight was reported by patient. Height was reported by patient. Body Surface Area: 1.65 m Body Mass Index: 29.29 kg/m  Pulse: 68 (Regular)  BP: 142/78 (Sitting, Left Arm, Standard)  Physical Exam General Mental Status -Alert, cooperative and good historian. General Appearance-pleasant, Not in acute distress. Orientation-Oriented X3. Build & Nutrition-Well nourished and Well developed.  Head and Neck Head-normocephalic, atraumatic . Neck Global Assessment - supple, no bruit auscultated on the right, no bruit auscultated on the left.  Eye Vision-Wears corrective lenses. Pupil - Bilateral-Regular and Round. Motion - Bilateral-EOMI.  ENMT Note: upper dentures  Chest and Lung Exam Auscultation Breath sounds - clear at anterior chest wall and clear at posterior chest wall. Adventitious sounds - No Adventitious sounds.  Cardiovascular Auscultation Rhythm - Regular rate and rhythm. Heart Sounds - S1 WNL and S2 WNL. Murmurs & Other Heart Sounds - Auscultation of the heart reveals - No  Murmurs.  Abdomen Palpation/Percussion Tenderness - Abdomen is non-tender to palpation. Rigidity (guarding) - Abdomen is soft. Auscultation Auscultation of the abdomen reveals - Bowel sounds normal.  Female Genitourinary Note: Not done, not pertinent to present illness  Musculoskeletal Note: On exam, well-developed female, alert and oriented, in no apparent distress. Her right knee shows no effusion. Range is 5 to 130. There is marked crepitus on range of motion of the knee. She is tender medial greater than lateral with no instability.  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 Abigail Miyamoto  IV TXA  Anesthesia Issues: Nausea in the past  Patient was instructed on what medications to stop prior to surgery.  Signed electronically by Lauraine Rinne, III PA-C

## 2017-06-02 LAB — ABO/RH: ABO/RH(D): O POS

## 2017-06-04 ENCOUNTER — Inpatient Hospital Stay (HOSPITAL_COMMUNITY): Payer: BC Managed Care – PPO | Admitting: Registered Nurse

## 2017-06-04 ENCOUNTER — Inpatient Hospital Stay (HOSPITAL_COMMUNITY)
Admission: RE | Admit: 2017-06-04 | Discharge: 2017-06-06 | DRG: 470 | Disposition: A | Discharge status: Home / Self Care | Payer: BC Managed Care – PPO | Source: Ambulatory Visit | Attending: Orthopedic Surgery | Admitting: Orthopedic Surgery

## 2017-06-04 ENCOUNTER — Encounter (HOSPITAL_COMMUNITY): Payer: Self-pay

## 2017-06-04 ENCOUNTER — Encounter (HOSPITAL_COMMUNITY)
Admission: RE | Disposition: A | Discharge status: Home / Self Care | Payer: Self-pay | Source: Ambulatory Visit | Attending: Orthopedic Surgery

## 2017-06-04 DIAGNOSIS — F419 Anxiety disorder, unspecified: Secondary | ICD-10-CM | POA: Diagnosis present

## 2017-06-04 DIAGNOSIS — R110 Nausea: Secondary | ICD-10-CM | POA: Diagnosis present

## 2017-06-04 DIAGNOSIS — M1710 Unilateral primary osteoarthritis, unspecified knee: Secondary | ICD-10-CM

## 2017-06-04 DIAGNOSIS — Z96653 Presence of artificial knee joint, bilateral: Secondary | ICD-10-CM | POA: Diagnosis present

## 2017-06-04 DIAGNOSIS — M179 Osteoarthritis of knee, unspecified: Secondary | ICD-10-CM | POA: Diagnosis present

## 2017-06-04 DIAGNOSIS — D649 Anemia, unspecified: Secondary | ICD-10-CM | POA: Diagnosis present

## 2017-06-04 DIAGNOSIS — M1711 Unilateral primary osteoarthritis, right knee: Principal | ICD-10-CM | POA: Diagnosis present

## 2017-06-04 DIAGNOSIS — K219 Gastro-esophageal reflux disease without esophagitis: Secondary | ICD-10-CM | POA: Diagnosis present

## 2017-06-04 HISTORY — PX: TOTAL KNEE ARTHROPLASTY: SHX125

## 2017-06-04 LAB — CBC
HCT: 34.1 % — ABNORMAL LOW (ref 36.0–46.0)
Hemoglobin: 10.6 g/dL — ABNORMAL LOW (ref 12.0–15.0)
MCH: 27.7 pg (ref 26.0–34.0)
MCHC: 31.1 g/dL (ref 30.0–36.0)
MCV: 89 fL (ref 78.0–100.0)
Platelets: 299 K/uL (ref 150–400)
RBC: 3.83 MIL/uL — ABNORMAL LOW (ref 3.87–5.11)
RDW: 15 % (ref 11.5–15.5)
WBC: 14 K/uL — ABNORMAL HIGH (ref 4.0–10.5)

## 2017-06-04 LAB — TYPE AND SCREEN
ABO/RH(D): O POS
Antibody Screen: NEGATIVE

## 2017-06-04 SURGERY — ARTHROPLASTY, KNEE, TOTAL
Anesthesia: Monitor Anesthesia Care | Site: Knee | Laterality: Right

## 2017-06-04 MED ORDER — RIVAROXABAN 10 MG PO TABS
10.0000 mg | ORAL_TABLET | Freq: Every day | ORAL | Status: DC
  Administered 2017-06-05 – 2017-06-06 (×2): 10 mg via ORAL
  Filled 2017-06-04: qty 1

## 2017-06-04 MED ORDER — VANCOMYCIN HCL IN DEXTROSE 1-5 GM/200ML-% IV SOLN
1000.0000 mg | Freq: Two times a day (BID) | INTRAVENOUS | Status: AC
  Administered 2017-06-04: 1000 mg via INTRAVENOUS
  Filled 2017-06-04: qty 200

## 2017-06-04 MED ORDER — PROPOFOL 10 MG/ML IV BOLUS
INTRAVENOUS | Status: AC
  Filled 2017-06-04: qty 40

## 2017-06-04 MED ORDER — MIDAZOLAM HCL 2 MG/2ML IJ SOLN
1.0000 mg | INTRAMUSCULAR | Status: DC | PRN
  Administered 2017-06-04: 2 mg via INTRAVENOUS
  Filled 2017-06-04: qty 2

## 2017-06-04 MED ORDER — PROMETHAZINE HCL 25 MG/ML IJ SOLN
INTRAMUSCULAR | Status: AC
  Filled 2017-06-04: qty 1

## 2017-06-04 MED ORDER — ONDANSETRON HCL 4 MG/2ML IJ SOLN
INTRAMUSCULAR | Status: DC | PRN
  Administered 2017-06-04: 4 mg via INTRAVENOUS

## 2017-06-04 MED ORDER — PHENOL 1.4 % MT LIQD: 1.0000 | OROMUCOSAL | Status: DC | PRN

## 2017-06-04 MED ORDER — HYDROMORPHONE HCL 1 MG/ML IJ SOLN
0.2500 mg | INTRAMUSCULAR | Status: DC | PRN
  Administered 2017-06-04 (×4): 0.25 mg via INTRAVENOUS

## 2017-06-04 MED ORDER — BUPIVACAINE HCL 0.25 % IJ SOLN
INTRAMUSCULAR | Status: DC | PRN
  Administered 2017-06-04: 30 mL

## 2017-06-04 MED ORDER — SODIUM CHLORIDE 0.9 % IV SOLN
INTRAVENOUS | Status: DC
  Administered 2017-06-04 – 2017-06-05 (×2): via INTRAVENOUS

## 2017-06-04 MED ORDER — SODIUM CHLORIDE 0.9 % IJ SOLN
INTRAMUSCULAR | Status: AC
  Filled 2017-06-04: qty 10

## 2017-06-04 MED ORDER — SUGAMMADEX SODIUM 200 MG/2ML IV SOLN
INTRAVENOUS | Status: DC | PRN
  Administered 2017-06-04: 150 mg via INTRAVENOUS

## 2017-06-04 MED ORDER — ROCURONIUM BROMIDE 50 MG/5ML IV SOSY
PREFILLED_SYRINGE | INTRAVENOUS | Status: AC
  Filled 2017-06-04: qty 5

## 2017-06-04 MED ORDER — ACETAMINOPHEN 10 MG/ML IV SOLN
1000.0000 mg | Freq: Once | INTRAVENOUS | Status: AC
  Administered 2017-06-04: 1000 mg via INTRAVENOUS

## 2017-06-04 MED ORDER — METOCLOPRAMIDE HCL 5 MG PO TABS: 5.0000 mg | ORAL_TABLET | Freq: Three times a day (TID) | ORAL | Status: DC | PRN

## 2017-06-04 MED ORDER — BISACODYL 10 MG RE SUPP: 10.0000 mg | Freq: Every day | RECTAL | Status: DC | PRN

## 2017-06-04 MED ORDER — POLYETHYLENE GLYCOL 3350 17 G PO PACK: 17.0000 g | PACK | Freq: Every day | ORAL | Status: DC | PRN

## 2017-06-04 MED ORDER — MIDAZOLAM HCL 2 MG/2ML IJ SOLN
INTRAMUSCULAR | Status: AC
  Administered 2017-06-04: 2 mg via INTRAVENOUS
  Filled 2017-06-04: qty 2

## 2017-06-04 MED ORDER — SUCCINYLCHOLINE CHLORIDE 20 MG/ML IJ SOLN
INTRAMUSCULAR | Status: DC | PRN
Start: 2017-06-04 — End: 2017-06-04
  Administered 2017-06-04: 100 mg via INTRAVENOUS

## 2017-06-04 MED ORDER — FENTANYL CITRATE (PF) 100 MCG/2ML IJ SOLN
INTRAMUSCULAR | Status: AC
  Administered 2017-06-04: 100 ug via INTRAVENOUS
  Filled 2017-06-04: qty 2

## 2017-06-04 MED ORDER — STERILE WATER FOR IRRIGATION IR SOLN
Status: DC | PRN
  Administered 2017-06-04: 2000 mL

## 2017-06-04 MED ORDER — VANCOMYCIN HCL IN DEXTROSE 1-5 GM/200ML-% IV SOLN
INTRAVENOUS | Status: AC
  Administered 2017-06-04: 1000 mg via INTRAVENOUS
  Filled 2017-06-04: qty 200

## 2017-06-04 MED ORDER — METHOCARBAMOL 1000 MG/10ML IJ SOLN
500.0000 mg | Freq: Four times a day (QID) | INTRAVENOUS | Status: DC | PRN
  Filled 2017-06-04: qty 5

## 2017-06-04 MED ORDER — LIDOCAINE 2% (20 MG/ML) 5 ML SYRINGE
INTRAMUSCULAR | Status: DC | PRN
  Administered 2017-06-04: 50 mg via INTRAVENOUS

## 2017-06-04 MED ORDER — METHOCARBAMOL 500 MG PO TABS
500.0000 mg | ORAL_TABLET | Freq: Four times a day (QID) | ORAL | Status: DC | PRN
  Administered 2017-06-05 – 2017-06-06 (×2): 500 mg via ORAL
  Filled 2017-06-04 (×3): qty 1

## 2017-06-04 MED ORDER — DOCUSATE SODIUM 100 MG PO CAPS
100.0000 mg | ORAL_CAPSULE | Freq: Two times a day (BID) | ORAL | Status: DC
  Administered 2017-06-05 – 2017-06-06 (×4): 100 mg via ORAL
  Filled 2017-06-04 (×4): qty 1

## 2017-06-04 MED ORDER — PANTOPRAZOLE SODIUM 40 MG PO TBEC
40.0000 mg | DELAYED_RELEASE_TABLET | Freq: Every day | ORAL | Status: DC
Start: 2017-06-05 — End: 2017-06-05
  Administered 2017-06-05: 40 mg via ORAL
  Filled 2017-06-04: qty 1

## 2017-06-04 MED ORDER — HYDROMORPHONE HCL 1 MG/ML IJ SOLN
INTRAMUSCULAR | Status: AC
  Filled 2017-06-04: qty 1

## 2017-06-04 MED ORDER — ONDANSETRON HCL 4 MG PO TABS: 4.0000 mg | ORAL_TABLET | Freq: Four times a day (QID) | ORAL | Status: DC | PRN

## 2017-06-04 MED ORDER — SODIUM CHLORIDE 0.9 % IJ SOLN
INTRAMUSCULAR | Status: DC | PRN
  Administered 2017-06-04: 60 mL via INTRAVENOUS

## 2017-06-04 MED ORDER — OXYCODONE HCL 5 MG PO TABS
5.0000 mg | ORAL_TABLET | ORAL | Status: DC | PRN
  Administered 2017-06-05: 10 mg via ORAL
  Filled 2017-06-04 (×2): qty 1
  Filled 2017-06-04: qty 2

## 2017-06-04 MED ORDER — ONDANSETRON HCL 4 MG/2ML IJ SOLN
INTRAMUSCULAR | Status: AC
  Filled 2017-06-04: qty 2

## 2017-06-04 MED ORDER — BUPIVACAINE LIPOSOME 1.3 % IJ SUSP
INTRAMUSCULAR | Status: DC | PRN
  Administered 2017-06-04: 20 mL

## 2017-06-04 MED ORDER — LACTATED RINGERS IV SOLN
INTRAVENOUS | Status: DC
  Administered 2017-06-04: 1000 mL via INTRAVENOUS

## 2017-06-04 MED ORDER — PROPOFOL 10 MG/ML IV BOLUS
INTRAVENOUS | Status: DC | PRN
  Administered 2017-06-04: 100 mg via INTRAVENOUS

## 2017-06-04 MED ORDER — ACETAMINOPHEN 325 MG PO TABS
650.0000 mg | ORAL_TABLET | Freq: Four times a day (QID) | ORAL | Status: DC | PRN
  Administered 2017-06-05 – 2017-06-06 (×2): 650 mg via ORAL
  Filled 2017-06-04 (×2): qty 2

## 2017-06-04 MED ORDER — DEXAMETHASONE SODIUM PHOSPHATE 10 MG/ML IJ SOLN
INTRAMUSCULAR | Status: AC
  Filled 2017-06-04: qty 1

## 2017-06-04 MED ORDER — LACTATED RINGERS IV SOLN: INTRAVENOUS | Status: DC

## 2017-06-04 MED ORDER — ACETAMINOPHEN 500 MG PO TABS
1000.0000 mg | ORAL_TABLET | Freq: Four times a day (QID) | ORAL | Status: AC
  Administered 2017-06-04 – 2017-06-05 (×4): 1000 mg via ORAL
  Filled 2017-06-04 (×4): qty 2

## 2017-06-04 MED ORDER — FENTANYL CITRATE (PF) 100 MCG/2ML IJ SOLN
50.0000 ug | INTRAMUSCULAR | Status: DC | PRN
  Administered 2017-06-04: 100 ug via INTRAVENOUS

## 2017-06-04 MED ORDER — PROMETHAZINE HCL 25 MG/ML IJ SOLN: 6.2500 mg | INTRAMUSCULAR | Status: DC | PRN

## 2017-06-04 MED ORDER — ACETAMINOPHEN 10 MG/ML IV SOLN
INTRAVENOUS | Status: AC
  Filled 2017-06-04: qty 100

## 2017-06-04 MED ORDER — ROPIVACAINE HCL 5 MG/ML IJ SOLN
INTRAMUSCULAR | Status: DC | PRN
  Administered 2017-06-04: 30 mg via PERINEURAL

## 2017-06-04 MED ORDER — FLEET ENEMA 7-19 GM/118ML RE ENEM: 1.0000 | ENEMA | Freq: Once | RECTAL | Status: DC | PRN

## 2017-06-04 MED ORDER — METOCLOPRAMIDE HCL 5 MG/ML IJ SOLN
5.0000 mg | Freq: Three times a day (TID) | INTRAMUSCULAR | Status: DC | PRN
  Administered 2017-06-04: 5 mg via INTRAVENOUS
  Filled 2017-06-04: qty 2

## 2017-06-04 MED ORDER — SODIUM CHLORIDE 0.9 % IJ SOLN
INTRAMUSCULAR | Status: AC
  Filled 2017-06-04: qty 50

## 2017-06-04 MED ORDER — 0.9 % SODIUM CHLORIDE (POUR BTL) OPTIME
TOPICAL | Status: DC | PRN
Start: 2017-06-04 — End: 2017-06-04
  Administered 2017-06-04: 1000 mL

## 2017-06-04 MED ORDER — LIDOCAINE 2% (20 MG/ML) 5 ML SYRINGE
INTRAMUSCULAR | Status: AC
  Filled 2017-06-04: qty 10

## 2017-06-04 MED ORDER — FENTANYL CITRATE (PF) 100 MCG/2ML IJ SOLN
INTRAMUSCULAR | Status: DC | PRN
  Administered 2017-06-04 (×2): 50 ug via INTRAVENOUS

## 2017-06-04 MED ORDER — ROCURONIUM BROMIDE 100 MG/10ML IV SOLN
INTRAVENOUS | Status: DC | PRN
  Administered 2017-06-04: 30 mg via INTRAVENOUS

## 2017-06-04 MED ORDER — BUPIVACAINE LIPOSOME 1.3 % IJ SUSP
20.0000 mL | Freq: Once | INTRAMUSCULAR | Status: DC
  Filled 2017-06-04: qty 20

## 2017-06-04 MED ORDER — DEXAMETHASONE SODIUM PHOSPHATE 10 MG/ML IJ SOLN
10.0000 mg | Freq: Once | INTRAMUSCULAR | Status: AC
  Administered 2017-06-04: 10 mg via INTRAVENOUS

## 2017-06-04 MED ORDER — MORPHINE SULFATE (PF) 2 MG/ML IV SOLN
1.0000 mg | INTRAVENOUS | Status: DC | PRN
  Administered 2017-06-04 – 2017-06-05 (×3): 1 mg via INTRAVENOUS
  Filled 2017-06-04 (×3): qty 1

## 2017-06-04 MED ORDER — FENTANYL CITRATE (PF) 100 MCG/2ML IJ SOLN
INTRAMUSCULAR | Status: AC
  Filled 2017-06-04: qty 2

## 2017-06-04 MED ORDER — DIPHENHYDRAMINE HCL 12.5 MG/5ML PO ELIX: 12.5000 mg | ORAL_SOLUTION | ORAL | Status: DC | PRN

## 2017-06-04 MED ORDER — BUPIVACAINE HCL (PF) 0.25 % IJ SOLN
INTRAMUSCULAR | Status: AC
  Filled 2017-06-04: qty 30

## 2017-06-04 MED ORDER — ACETAMINOPHEN 650 MG RE SUPP: 650.0000 mg | Freq: Four times a day (QID) | RECTAL | Status: DC | PRN

## 2017-06-04 MED ORDER — VANCOMYCIN HCL IN DEXTROSE 1-5 GM/200ML-% IV SOLN
1000.0000 mg | INTRAVENOUS | Status: AC
  Administered 2017-06-04: 1000 mg via INTRAVENOUS

## 2017-06-04 MED ORDER — TRANEXAMIC ACID 1000 MG/10ML IV SOLN
1000.0000 mg | INTRAVENOUS | Status: DC
  Filled 2017-06-04: qty 10

## 2017-06-04 MED ORDER — TRANEXAMIC ACID 1000 MG/10ML IV SOLN
1000.0000 mg | Freq: Once | INTRAVENOUS | Status: AC
  Administered 2017-06-04: 1000 mg via INTRAVENOUS
  Filled 2017-06-04: qty 10
  Filled 2017-06-04: qty 1100

## 2017-06-04 MED ORDER — CHLORHEXIDINE GLUCONATE 4 % EX LIQD: 60.0000 mL | Freq: Once | CUTANEOUS | Status: DC

## 2017-06-04 MED ORDER — TRAMADOL HCL 50 MG PO TABS: 50.0000 mg | ORAL_TABLET | Freq: Four times a day (QID) | ORAL | Status: DC | PRN

## 2017-06-04 MED ORDER — PROPOFOL 500 MG/50ML IV EMUL
INTRAVENOUS | Status: DC | PRN
  Administered 2017-06-04: 40 ug/kg/min via INTRAVENOUS

## 2017-06-04 MED ORDER — SODIUM CHLORIDE 0.9 % IR SOLN
Status: DC | PRN
  Administered 2017-06-04: 1000 mL

## 2017-06-04 MED ORDER — MENTHOL 3 MG MT LOZG: 1.0000 | LOZENGE | OROMUCOSAL | Status: DC | PRN

## 2017-06-04 MED ORDER — DEXAMETHASONE SODIUM PHOSPHATE 10 MG/ML IJ SOLN
10.0000 mg | Freq: Once | INTRAMUSCULAR | Status: AC
  Administered 2017-06-05: 10 mg via INTRAVENOUS
  Filled 2017-06-04: qty 1

## 2017-06-04 MED ORDER — ONDANSETRON HCL 4 MG/2ML IJ SOLN: 4.0000 mg | Freq: Four times a day (QID) | INTRAMUSCULAR | Status: DC | PRN

## 2017-06-04 SURGICAL SUPPLY — 48 items
BAG SPEC THK2 15X12 ZIP CLS (MISCELLANEOUS) ×1
BAG ZIPLOCK 12X15 (MISCELLANEOUS) ×2 IMPLANT
BANDAGE ACE 6X5 VEL STRL LF (GAUZE/BANDAGES/DRESSINGS) ×2 IMPLANT
BLADE SAG 18X100X1.27 (BLADE) ×2 IMPLANT
BLADE SAW SGTL 11.0X1.19X90.0M (BLADE) ×2 IMPLANT
BOWL SMART MIX CTS (DISPOSABLE) ×2 IMPLANT
CAPT KNEE TOTAL 3 ATTUNE ×1 IMPLANT
CEMENT HV SMART SET (Cement) ×4 IMPLANT
COVER SURGICAL LIGHT HANDLE (MISCELLANEOUS) ×2 IMPLANT
CUFF TOURN SGL QUICK 34 (TOURNIQUET CUFF) ×2
CUFF TRNQT CYL 34X4X40X1 (TOURNIQUET CUFF) ×1 IMPLANT
DRAPE U-SHAPE 47X51 STRL (DRAPES) ×2 IMPLANT
DRSG ADAPTIC 3X8 NADH LF (GAUZE/BANDAGES/DRESSINGS) ×2 IMPLANT
DURAPREP 26ML APPLICATOR (WOUND CARE) ×2 IMPLANT
ELECT REM PT RETURN 15FT ADLT (MISCELLANEOUS) ×2 IMPLANT
EVACUATOR 1/8 PVC DRAIN (DRAIN) ×2 IMPLANT
GAUZE SPONGE 4X4 12PLY STRL (GAUZE/BANDAGES/DRESSINGS) ×2 IMPLANT
GLOVE BIO SURGEON STRL SZ 6.5 (GLOVE) ×1 IMPLANT
GLOVE BIO SURGEON STRL SZ8 (GLOVE) ×2 IMPLANT
GLOVE BIOGEL PI IND STRL 6.5 (GLOVE) IMPLANT
GLOVE BIOGEL PI IND STRL 7.5 (GLOVE) IMPLANT
GLOVE BIOGEL PI IND STRL 8 (GLOVE) ×1 IMPLANT
GLOVE BIOGEL PI INDICATOR 6.5 (GLOVE) ×2
GLOVE BIOGEL PI INDICATOR 7.5 (GLOVE) ×3
GLOVE BIOGEL PI INDICATOR 8 (GLOVE) ×1
GLOVE SURG SS PI 6.5 STRL IVOR (GLOVE) ×1 IMPLANT
GLOVE SURG SS PI 7.5 STRL IVOR (GLOVE) ×1 IMPLANT
GOWN SPEC L3 XXLG W/TWL (GOWN DISPOSABLE) ×1 IMPLANT
GOWN STRL REUS W/TWL LRG LVL3 (GOWN DISPOSABLE) ×4 IMPLANT
HANDPIECE INTERPULSE COAX TIP (DISPOSABLE) ×2
IMMOBILIZER KNEE 20 (SOFTGOODS) ×2
IMMOBILIZER KNEE 20 THIGH 36 (SOFTGOODS) ×1 IMPLANT
MANIFOLD NEPTUNE II (INSTRUMENTS) ×2 IMPLANT
PACK TOTAL KNEE CUSTOM (KITS) ×2 IMPLANT
PAD ABD 8X10 STRL (GAUZE/BANDAGES/DRESSINGS) ×1 IMPLANT
PADDING CAST COTTON 6X4 STRL (CAST SUPPLIES) ×6 IMPLANT
POSITIONER SURGICAL ARM (MISCELLANEOUS) ×2 IMPLANT
SET HNDPC FAN SPRY TIP SCT (DISPOSABLE) ×1 IMPLANT
STRIP CLOSURE SKIN 1/2X4 (GAUZE/BANDAGES/DRESSINGS) ×4 IMPLANT
SUT MNCRL AB 4-0 PS2 18 (SUTURE) ×2 IMPLANT
SUT STRATAFIX 0 PDS 27 VIOLET (SUTURE) ×2
SUT VIC AB 2-0 CT1 27 (SUTURE) ×6
SUT VIC AB 2-0 CT1 TAPERPNT 27 (SUTURE) ×3 IMPLANT
SUTURE STRATFX 0 PDS 27 VIOLET (SUTURE) ×1 IMPLANT
SYR 30ML LL (SYRINGE) ×1 IMPLANT
TRAY FOLEY CATH SILVER 14FR (SET/KITS/TRAYS/PACK) ×1 IMPLANT
WRAP KNEE MAXI GEL POST OP (GAUZE/BANDAGES/DRESSINGS) ×2 IMPLANT
YANKAUER SUCT BULB TIP 10FT TU (MISCELLANEOUS) ×2 IMPLANT

## 2017-06-04 NOTE — Discharge Instructions (Addendum)
° °Dr. Frank Aluisio °Total Joint Specialist °[facility] °[address] °, [place] [address] °[phone] ° °TOTAL KNEE REPLACEMENT POSTOPERATIVE DIRECTIONS ° °Knee Rehabilitation, Guidelines Following Surgery  °Results after knee surgery are often greatly improved when you follow the exercise, range of motion and muscle strengthening exercises prescribed by your doctor. Safety measures are also important to protect the knee from further injury. Any time any of these exercises cause you to have increased pain or swelling in your knee joint, decrease the amount until you are comfortable again and slowly increase them. If you have problems or questions, call your caregiver or physical therapist for advice.  ° °HOME CARE INSTRUCTIONS  °Remove items at home which could result in a fall. This includes throw rugs or furniture in walking pathways.  °· ICE to the affected knee every three hours for 30 minutes at a time and then as needed for pain and swelling.  Continue to use ice on the knee for pain and swelling from surgery. You may notice swelling that will progress down to the foot and ankle.  This is normal after surgery.  Elevate the leg when you are not up walking on it.   °· Continue to use the breathing machine which will help keep your temperature down.  It is common for your temperature to cycle up and down following surgery, especially at night when you are not up moving around and exerting yourself.  The breathing machine keeps your lungs expanded and your temperature down. °· Do not place pillow under knee, focus on keeping the knee straight while resting ° °DIET °You may resume your previous home diet once your are discharged from the hospital. ° °DRESSING / WOUND CARE / SHOWERING °You may shower 3 days after surgery, but keep the wounds dry during showering.  You may use an occlusive plastic wrap (Press'n Seal for example), NO SOAKING/SUBMERGING IN THE BATHTUB.  If the  bandage gets wet, change with a clean dry gauze.  If the incision gets wet, pat the wound dry with a clean towel. °You may start showering once you are discharged home but do not submerge the incision under water. Just pat the incision dry and apply a dry gauze dressing on daily. °Change the surgical dressing daily and reapply a dry dressing each time. ° °ACTIVITY °Walk with your walker as instructed. °Use walker as long as suggested by your caregivers. °Avoid periods of inactivity such as sitting longer than an hour when not asleep. This helps prevent blood clots.  °You may resume a sexual relationship in one month or when given the OK by your doctor.  °You may return to work once you are cleared by your doctor.  °Do not drive a car for 6 weeks or until released by you surgeon.  °Do not drive while taking narcotics. ° °WEIGHT BEARING °Weight bearing as tolerated with assist device (walker, cane, etc) as directed, use it as long as suggested by your surgeon or therapist, typically at least 4-6 weeks. ° °POSTOPERATIVE CONSTIPATION PROTOCOL °Constipation - defined medically as fewer than three stools per week and severe constipation as less than one stool per week. ° °One of the most common issues patients have following surgery is constipation.  Even if you have a regular bowel pattern at home, your normal regimen is likely to be disrupted due to multiple reasons following surgery.  Combination of anesthesia, postoperative narcotics, change in appetite and fluid intake all can affect your bowels.    In order to avoid complications following surgery, here are some recommendations in order to help you during your recovery period. ° °Colace (docusate) - Pick up an over-the-counter form of Colace or another stool softener and take twice a day as long as you are requiring postoperative pain medications.  Take with a full glass of water daily.  If you experience loose stools or diarrhea, hold the colace until you stool forms  back up.  If your symptoms do not get better within 1 week or if they get worse, check with your doctor. ° °Dulcolax (bisacodyl) - Pick up over-the-counter and take as directed by the product packaging as needed to assist with the movement of your bowels.  Take with a full glass of water.  Use this product as needed if not relieved by Colace only.  ° °MiraLax (polyethylene glycol) - Pick up over-the-counter to have on hand.  MiraLax is a solution that will increase the amount of water in your bowels to assist with bowel movements.  Take as directed and can mix with a glass of water, juice, soda, coffee, or tea.  Take if you go more than two days without a movement. °Do not use MiraLax more than once per day. Call your doctor if you are still constipated or irregular after using this medication for 7 days in a row. ° °If you continue to have problems with postoperative constipation, please contact the office for further assistance and recommendations.  If you experience "the worst abdominal pain ever" or develop nausea or vomiting, please contact the office immediatly for further recommendations for treatment. ° °ITCHING ° If you experience itching with your medications, try taking only a single pain pill, or even half a pain pill at a time.  You can also use Benadryl over the counter for itching or also to help with sleep.  ° °TED HOSE STOCKINGS °Wear the elastic stockings on both legs for three weeks following surgery during the day but you may remove then at night for sleeping. ° °MEDICATIONS °See your medication summary on the “After Visit Summary” that the nursing staff will review with you prior to discharge.  You may have some home medications which will be placed on hold until you complete the course of blood thinner medication.  It is important for you to complete the blood thinner medication as prescribed by your surgeon.  Continue your approved medications as instructed at time of  discharge. ° °PRECAUTIONS °If you experience chest pain or shortness of breath - call 911 immediately for transfer to the hospital emergency department.  °If you develop a fever greater that 101 F, purulent drainage from wound, increased redness or drainage from wound, foul odor from the wound/dressing, or calf pain - CONTACT YOUR SURGEON.   °                                                °FOLLOW-UP APPOINTMENTS °Make sure you keep all of your appointments after your operation with your surgeon and caregivers. You should call the office at the above phone number and make an appointment for approximately two weeks after the date of your surgery or on the date instructed by your surgeon outlined in the "After Visit Summary". ° ° °RANGE OF MOTION AND STRENGTHENING EXERCISES  °Rehabilitation of the knee is important following a knee injury or   an operation. After just a few days of immobilization, the muscles of the thigh which control the knee become weakened and shrink (atrophy). Knee exercises are designed to build up the tone and strength of the thigh muscles and to improve knee motion. Often times heat used for twenty to thirty minutes before working out will loosen up your tissues and help with improving the range of motion but do not use heat for the first two weeks following surgery. These exercises can be done on a training (exercise) mat, on the floor, on a table or on a bed. Use what ever works the best and is most comfortable for you Knee exercises include:  °Leg Lifts - While your knee is still immobilized in a splint or cast, you can do straight leg raises. Lift the leg to 60 degrees, hold for 3 sec, and slowly lower the leg. Repeat 10-20 times 2-3 times daily. Perform this exercise against resistance later as your knee gets better.  °Quad and Hamstring Sets - Tighten up the muscle on the front of the thigh (Quad) and hold for 5-10 sec. Repeat this 10-20 times hourly. Hamstring sets are done by pushing the  foot backward against an object and holding for 5-10 sec. Repeat as with quad sets.  °· Leg Slides: Lying on your back, slowly slide your foot toward your buttocks, bending your knee up off the floor (only go as far as is comfortable). Then slowly slide your foot back down until your leg is flat on the floor again. °· Angel Wings: Lying on your back spread your legs to the side as far apart as you can without causing discomfort.  °A rehabilitation program following serious knee injuries can speed recovery and prevent re-injury in the future due to weakened muscles. Contact your doctor or a physical therapist for more information on knee rehabilitation.  ° °IF YOU ARE TRANSFERRED TO A SKILLED REHAB FACILITY °If the patient is transferred to a skilled rehab facility following release from the hospital, a list of the current medications will be sent to the facility for the patient to continue.  When discharged from the skilled rehab facility, please have the facility set up the patient's Home Health Physical Therapy prior to being released. Also, the skilled facility will be responsible for providing the patient with their medications at time of release from the facility to include their pain medication, the muscle relaxants, and their blood thinner medication. If the patient is still at the rehab facility at time of the two week follow up appointment, the skilled rehab facility will also need to assist the patient in arranging follow up appointment in our office and any transportation needs. ° °MAKE SURE YOU:  °Understand these instructions.  °Get help right away if you are not doing well or get worse.  ° ° °Pick up stool softner and laxative for home use following surgery while on pain medications. °Do not submerge incision under water. °Please use good hand washing techniques while changing dressing each day. °May shower starting three days after surgery. °Please use a clean towel to pat the incision dry following  showers. °Continue to use ice for pain and swelling after surgery. °Do not use any lotions or creams on the incision until instructed by your surgeon. ° °Take Xarelto for two and a half more weeks following discharge from the hospital, then discontinue Xarelto. °Once the patient has completed the blood thinner regimen, then take a Baby 81 mg Aspirin daily for three   more weeks. ° ° ° °Information on my medicine - XARELTO® (Rivaroxaban) ° °This medication education was reviewed with me or my healthcare representative as part of my discharge preparation.   ° °Why was Xarelto® prescribed for you? °Xarelto® was prescribed for you to reduce the risk of blood clots forming after orthopedic surgery. The medical term for these abnormal blood clots is venous thromboembolism (VTE). ° °What do you need to know about xarelto® ? °Take your Xarelto® ONCE DAILY at the same time every day. °You may take it either with or without food. ° °If you have difficulty swallowing the tablet whole, you may crush it and mix in applesauce just prior to taking your dose. ° °Take Xarelto® exactly as prescribed by your doctor and DO NOT stop taking Xarelto® without talking to the doctor who prescribed the medication.  Stopping without other VTE prevention medication to take the place of Xarelto® may increase your risk of developing a clot. ° °After discharge, you should have regular check-up appointments with your healthcare provider that is prescribing your Xarelto®.   ° °What do you do if you miss a dose? °If you miss a dose, take it as soon as you remember on the same day then continue your regularly scheduled once daily regimen the next day. Do not take two doses of Xarelto® on the same day.  ° °Important Safety Information °A possible side effect of Xarelto® is bleeding. You should call your healthcare provider right away if you experience any of the following: °? Bleeding from an injury or your nose that does not stop. °? Unusual colored  urine (red or dark brown) or unusual colored stools (red or black). °? Unusual bruising for unknown reasons. °? A serious fall or if you hit your head (even if there is no bleeding). ° °Some medicines may interact with Xarelto® and might increase your risk of bleeding while on Xarelto®. To help avoid this, consult your healthcare provider or pharmacist prior to using any new prescription or non-prescription medications, including herbals, vitamins, non-steroidal anti-inflammatory drugs (NSAIDs) and supplements. ° °This website has more information on Xarelto®: www.xarelto.com. ° ° °

## 2017-06-04 NOTE — Anesthesia Preprocedure Evaluation (Signed)
Anesthesia Evaluation  Patient identified by MRN, date of birth, ID band Patient awake    Reviewed: Allergy & Precautions, H&P , NPO status , Patient's Chart, lab work & pertinent test results  History of Anesthesia Complications (+) PONV  Airway Mallampati: II  TM Distance: >3 FB Neck ROM: Full    Dental  (+) Edentulous Upper, Dental Advisory Given   Pulmonary shortness of breath,    Pulmonary exam normal breath sounds clear to auscultation       Cardiovascular Exercise Tolerance: Good negative cardio ROS Normal cardiovascular exam Rhythm:Regular Rate:Normal  ECG: NSR, prolonged QT   Neuro/Psych Anxiety negative neurological ROS     GI/Hepatic negative GI ROS, Neg liver ROS, GERD  Medicated and Controlled,  Endo/Other  negative endocrine ROS  Renal/GU negative Renal ROS  negative genitourinary   Musculoskeletal negative musculoskeletal ROS (+) Arthritis ,   Abdominal (+) - obese,   Peds negative pediatric ROS (+)  Hematology negative hematology ROS (+)   Anesthesia Other Findings   Reproductive/Obstetrics negative OB ROS                             Anesthesia Physical  Anesthesia Plan  ASA: II  Anesthesia Plan: MAC and Spinal   Post-op Pain Management:  Regional for Post-op pain   Induction:   PONV Risk Score and Plan: Ondansetron and Dexamethasone  Airway Management Planned: Simple Face Mask  Additional Equipment:   Intra-op Plan:   Post-operative Plan:   Informed Consent: I have reviewed the patients History and Physical, chart, labs and discussed the procedure including the risks, benefits and alternatives for the proposed anesthesia with the patient or authorized representative who has indicated his/her understanding and acceptance.   Dental advisory given  Plan Discussed with: CRNA and Anesthesiologist  Anesthesia Plan Comments:         Anesthesia Quick  Evaluation

## 2017-06-04 NOTE — Op Note (Signed)
OPERATIVE REPORT-TOTAL KNEE ARTHROPLASTY   Pre-operative diagnosis- Osteoarthritis  Right knee(s)  Post-operative diagnosis- Osteoarthritis Right knee(s)  Procedure-  Right  Total Knee Arthroplasty  Surgeon- Gus Rankin. Codi Kertz, MD  Assistant- Dimitri Ped, PA-C   Anesthesia-  GA combined with regional for post-op pain  EBL-* No blood loss amount entered *   Drains Hemovac  Tourniquet time-  Total Tourniquet Time Documented: Thigh (Right) - 35 minutes Total: Thigh (Right) - 35 minutes     Complications- None  Condition-PACU - hemodynamically stable.   Brief Clinical Note  Samantha Benton is a 61 y.o. year old female with end stage OA of her right knee with progressively worsening pain and dysfunction. She has constant pain, with activity and at rest and significant functional deficits with difficulties even with ADLs. She has had extensive non-op management including analgesics, injections of cortisone and viscosupplements, and home exercise program, but remains in significant pain with significant dysfunction.Radiographs show bone on bone arthritis medial and patellofemoral. She presents now for right Total Knee Arthroplasty.    Procedure in detail---   The patient is brought into the operating room and positioned supine on the operating table. After successful administration of  GA combined with regional for post-op pain,   a tourniquet is placed high on the  Right thigh(s) and the lower extremity is prepped and draped in the usual sterile fashion. Time out is performed by the operating team and then the  Right lower extremity is wrapped in Esmarch, knee flexed and the tourniquet inflated to 300 mmHg.       A midline incision is made with a ten blade through the subcutaneous tissue to the level of the extensor mechanism. A fresh blade is used to make a medial parapatellar arthrotomy. Soft tissue over the proximal medial tibia is subperiosteally elevated to the joint line with a  knife and into the semimembranosus bursa with a Cobb elevator. Soft tissue over the proximal lateral tibia is elevated with attention being paid to avoiding the patellar tendon on the tibial tubercle. The patella is everted, knee flexed 90 degrees and the ACL and PCL are removed. Findings are bone on bone medial and patellofemoral with large global osteophytes.        The drill is used to create a starting hole in the distal femur and the canal is thoroughly irrigated with sterile saline to remove the fatty contents. The 5 degree Right  valgus alignment guide is placed into the femoral canal and the distal femoral cutting block is pinned to remove 9 mm off the distal femur. Resection is made with an oscillating saw.      The tibia is subluxed forward and the menisci are removed. The extramedullary alignment guide is placed referencing proximally at the medial aspect of the tibial tubercle and distally along the second metatarsal axis and tibial crest. The block is pinned to remove 2mm off the more deficient medial  side. Resection is made with an oscillating saw. Size 4is the most appropriate size for the tibia and the proximal tibia is prepared with the modular drill and keel punch for that size.      The femoral sizing guide is placed and size 5 is most appropriate. Rotation is marked off the epicondylar axis and confirmed by creating a rectangular flexion gap at 90 degrees. The size 5 cutting block is pinned in this rotation and the anterior, posterior and chamfer cuts are made with the oscillating saw. The intercondylar block is then  placed and that cut is made.      Trial size 4 tibial component, trial size 5 posterior stabilized femur and a 8  mm posterior stabilized rotating platform insert trial is placed. Full extension is achieved with excellent varus/valgus and anterior/posterior balance throughout full range of motion. The patella is everted and thickness measured to be 22  mm. Free hand resection is  taken to 12 mm, a 35 template is placed, lug holes are drilled, trial patella is placed, and it tracks normally. Osteophytes are removed off the posterior femur with the trial in place. All trials are removed and the cut bone surfaces prepared with pulsatile lavage. Cement is mixed and once ready for implantation, the size 4 tibial implant, size  5 posterior stabilized femoral component, and the size 35 patella are cemented in place and the patella is held with the clamp. The trial insert is placed and the knee held in full extension. The Exparel (20 ml mixed with 60 ml saline) is injected into the extensor mechanism, posterior capsule, medial and lateral gutters and subcutaneous tissues.  All extruded cement is removed and once the cement is hard the permanent 8 mm posterior stabilized rotating platform insert is placed into the tibial tray.      The wound is copiously irrigated with saline solution and the extensor mechanism closed over a hemovac drain with #1 V-loc suture. The tourniquet is released for a total tourniquet time of 35  minutes. Flexion against gravity is 140 degrees and the patella tracks normally. Subcutaneous tissue is closed with 2.0 vicryl and subcuticular with running 4.0 Monocryl. The incision is cleaned and dried and steri-strips and a bulky sterile dressing are applied. The limb is placed into a knee immobilizer and the patient is awakened and transported to recovery in stable condition.      Please note that a surgical assistant was a medical necessity for this procedure in order to perform it in a safe and expeditious manner. Surgical assistant was necessary to retract the ligaments and vital neurovascular structures to prevent injury to them and also necessary for proper positioning of the limb to allow for anatomic placement of the prosthesis.   Gus RankinFrank V. Steffanie Mingle, MD    06/04/2017, 12:14 PM

## 2017-06-04 NOTE — Progress Notes (Signed)
Assisted Dr. Singer with right, ultrasound guided, adductor canal block. Side rails up, monitors on throughout procedure. See vital signs in flow sheet. Tolerated Procedure well.  

## 2017-06-04 NOTE — Transfer of Care (Signed)
Immediate Anesthesia Transfer of Care Note  Patient: Chenelle Verga  Procedure(s) Performed: Procedure(s) with comments: RIGHT TOTAL KNEE ARTHROPLASTY (Right) - Adductor Block  Patient Location: PACU  Anesthesia Type:General  Level of Consciousness: awake, alert , oriented and patient cooperative  Airway & Oxygen Therapy: Patient Spontanous Breathing and Patient connected to face mask oxygen  Post-op Assessment: Report given to RN, Post -op Vital signs reviewed and stable and Patient moving all extremities  Post vital signs: Reviewed and stable  Last Vitals:  Vitals:   06/04/17 1048 06/04/17 1049  BP:    Pulse: 84 79  Resp: 19 (!) 22  Temp:      Last Pain:  Vitals:   06/04/17 1008  TempSrc:   PainSc: 5       Patients Stated Pain Goal: 4 (06/04/17 1008)  Complications: No apparent anesthesia complications

## 2017-06-04 NOTE — Progress Notes (Signed)
PT Cancellation Note  Patient Details Name: Chip BoerDebora Shubert MRN: [id number] DOB: 30-May-1956   Cancelled Treatment:    Reason Eval/Treat Not Completed: Medical issues which prohibited therapy due toNausea   Sharen HeckHill, Ansley Mangiapane Elizabeth Akilah Cureton PT [phone]  06/04/2017, 4:38 PM

## 2017-06-04 NOTE — H&P (View-Only) (Signed)
Samantha BeamDebora V Benton DOB: Jan 31, 1956 Married / Language: English / Race: Black or African American Female Date of Admission:  06/04/2017 CC:  Right Knee Pain History of Present Illness The patient is a 61 year old female who comes in for a preoperative History and Physical. The patient is scheduled for a right total knee arthroplasty to be performed by Dr. Gus RankinFrank V. Aluisio, [facility]04[facility]. The patient is a 61 year old female who presented for follow up of their knee. The patient is being followed for their right knee pain and osteoarthritis. They are now month(s) out from Euflexxa. Symptoms reported include: pain, pain after sitting, swelling, pain with weightbearing and difficulty ambulating. The patient feels that they are doing poorly and report their pain level to be moderate to severe (constant pain). The following medication has been used for pain control: antiinflammatory medication (BC powder). The patient has not gotten any relief of their symptoms with Cortisone injections or viscosupplementation. Unfortunately, her right knee is getting progressively worse. The injections have not helped at all. It is limiting what she can and cannot do. She is at a stage now where she is ready to go ahead and contemplate getting it fixed. She did have problems when she had her left knee done, had a lot of stiffness. She would like to go ahead and proceed with surgery at this time. They have been treated conservatively in the past for the above stated problem and despite conservative measures, they continue to have progressive pain and severe functional limitations and dysfunction. They have failed non-operative management including home exercise, medications, and injections. It is felt that they would benefit from undergoing total joint replacement. Risks and benefits of the procedure have been discussed with the patient and they elect to proceed with surgery. There are no active  contraindications to surgery such as ongoing infection or rapidly progressive neurological disease.  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 TraMADol HCl *ANALGESICS - OPIOID*  Nausea, Vomiting.  Family History First Degree Relatives  reported Liver Disease, Chronic  child Father  Deceased, Myocardial infarction. age 61 Mother  Deceased. age 61 Children  Daughter - Liver Disorder  Social History  Number of flights of stairs before winded  less than 1 Not under pain contract  Marital status  married Never consumed alcohol  09/03/2014: Never consumed alcohol Living situation  live with spouse No history of drug/alcohol rehab  Tobacco / smoke exposure  09/03/2014: no Tobacco use  Never smoker. 09/03/2014 Children  2 Exercise  Exercises rarely Current work status  working full time  Medication History BCAD 1 (Oral) Active. Vitamin D (Ergocalciferol) (1 Oral weekly) Specific strength unknown - Active. Mobic (15MG  Tablet, Oral) Active. Omeprazole (40MG  Capsule DR, Oral) Active. Aleve (220MG  Capsule, 1 (one) Oral) Active.  Past Surgical History Carpal Tunnel Repair  right Total Knee Replacement  left Gallbladder Surgery  open  Review of Systems General Not Present- Chills, Fatigue, Fever, Memory Loss, Night Sweats, Weight Gain and Weight Loss. Skin Not Present- Eczema, Hives, Itching, Lesions and Rash. HEENT Not Present- Dentures, Double Vision, Headache, Hearing Loss, Tinnitus and Visual Loss. Respiratory Not Present- Allergies, Chronic Cough, Coughing up blood, Shortness  of breath at rest and  Shortness of breath with exertion. Cardiovascular Not Present- Chest Pain, Difficulty Breathing Lying Down, Murmur, Palpitations, Racing/skipping heartbeats and Swelling. Gastrointestinal Not Present- Abdominal Pain, Bloody Stool, Constipation, Diarrhea, Difficulty Swallowing, Heartburn, Jaundice, Loss of appetitie, Nausea and Vomiting. Female Genitourinary Not Present- Blood in Urine, Discharge, Flank Pain, Incontinence, Painful Urination, Urgency, Urinary frequency, Urinary Retention, Urinating at Night and Weak urinary stream. Musculoskeletal Present- Joint Pain. Not Present- Back Pain, Joint Swelling, Morning Stiffness, Muscle Pain, Muscle Weakness and Spasms. Neurological Not Present- Blackout spells, Difficulty with balance, Dizziness, Paralysis, Tremor and Weakness. Psychiatric Not Present- Insomnia.  Vitals  Weight: 150 lb Height: 60in Weight was reported by patient. Height was reported by patient. Body Surface Area: 1.65 m Body Mass Index: 29.29 kg/m  Pulse: 68 (Regular)  BP: 142/78 (Sitting, Left Arm, Standard)  Physical Exam General Mental Status -Alert, cooperative and good historian. General Appearance-pleasant, Not in acute distress. Orientation-Oriented X3. Build & Nutrition-Well nourished and Well developed.  Head and Neck Head-normocephalic, atraumatic . Neck Global Assessment - supple, no bruit auscultated on the right, no bruit auscultated on the left.  Eye Vision-Wears corrective lenses. Pupil - Bilateral-Regular and Round. Motion - Bilateral-EOMI.  ENMT Note: upper dentures  Chest and Lung Exam Auscultation Breath sounds - clear at anterior chest wall and clear at posterior chest wall. Adventitious sounds - No Adventitious sounds.  Cardiovascular Auscultation Rhythm - Regular rate and rhythm. Heart Sounds - S1 WNL and S2 WNL. Murmurs & Other Heart Sounds - Auscultation of the heart reveals - No  Murmurs.  Abdomen Palpation/Percussion Tenderness - Abdomen is non-tender to palpation. Rigidity (guarding) - Abdomen is soft. Auscultation Auscultation of the abdomen reveals - Bowel sounds normal.  Female Genitourinary Note: Not done, not pertinent to present illness  Musculoskeletal Note: On exam, well-developed female, alert and oriented, in no apparent distress. Her right knee shows no effusion. Range is 5 to 130. There is marked crepitus on range of motion of the knee. She is tender medial greater than lateral with no instability.  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 Abigail Miyamoto  IV TXA  Anesthesia Issues: Nausea in the past  Patient was instructed on what medications to stop prior to surgery.  Signed electronically by Lauraine Rinne, III PA-C

## 2017-06-04 NOTE — Anesthesia Procedure Notes (Signed)
Anesthesia Regional Block: Adductor canal block   Pre-Anesthetic Checklist: ,, timeout performed, Correct Patient, Correct Site, Correct Laterality, Correct Procedure, Correct Position, site marked, Risks and benefits discussed,  Surgical consent,  Pre-op evaluation,  At surgeon's request and post-op pain management  Laterality: Right  Prep: chloraprep       Needles:  Injection technique: Single-shot  Needle Type: Stimulator Needle - 80     Needle Length: 10cm  Needle Gauge: 21     Additional Needles:   Procedures: ultrasound guided,,,,,,,,  Narrative:  Start time: 06/04/2017 10:39 AM End time: 06/04/2017 9:49 AM Injection made incrementally with aspirations every 5 mL.  Performed by: Personally

## 2017-06-04 NOTE — Anesthesia Procedure Notes (Signed)
Procedure Name: Intubation Date/Time: 06/04/2017 11:18 AM Performed by: Carleene Cooper A Pre-anesthesia Checklist: Patient identified, Emergency Drugs available, Suction available, Patient being monitored and Timeout performed Patient Re-evaluated:Patient Re-evaluated prior to inductionOxygen Delivery Method: Circle system utilized Preoxygenation: Pre-oxygenation with 100% oxygen Intubation Type: IV induction Ventilation: Mask ventilation without difficulty Laryngoscope Size: Mac and 4 Grade View: Grade I Tube type: Oral Number of attempts: 1 Airway Equipment and Method: Stylet Placement Confirmation: ETT inserted through vocal cords under direct vision,  positive ETCO2 and breath sounds checked- equal and bilateral Secured at: 21 cm Tube secured with: Tape Dental Injury: Teeth and Oropharynx as per pre-operative assessment

## 2017-06-04 NOTE — Anesthesia Postprocedure Evaluation (Signed)
Anesthesia Post Note  Patient: Samantha Benton  Procedure(s) Performed: Procedure(s) (LRB): RIGHT TOTAL KNEE ARTHROPLASTY (Right)     Patient location during evaluation: PACU Anesthesia Type: General Level of consciousness: sedated Pain management: pain level controlled Vital Signs Assessment: post-procedure vital signs reviewed and stable Respiratory status: spontaneous breathing and respiratory function stable Cardiovascular status: stable Anesthetic complications: no    Last Vitals:  Vitals:   06/04/17 1315 06/04/17 1330  BP: 140/67 137/70  Pulse: 85 87  Resp: 17 13  Temp:  36.6 C    Last Pain:  Vitals:   06/04/17 1330  TempSrc:   PainSc: Asleep                 Braxley Balandran DANIEL

## 2017-06-04 NOTE — Interval H&P Note (Signed)
History and Physical Interval Note:  06/04/2017 10:14 AM  Samantha Benton  has presented today for surgery, with the diagnosis of Osteoarthritis Right Knee  The various methods of treatment have been discussed with the patient and family. After consideration of risks, benefits and other options for treatment, the patient has consented to  Procedure(s): RIGHT TOTAL KNEE ARTHROPLASTY (Right) as a surgical intervention .  The patient's history has been reviewed, patient examined, no change in status, stable for surgery.  I have reviewed the patient's chart and labs.  Questions were answered to the patient's satisfaction.     Loanne DrillingALUISIO,Dodie Parisi V

## 2017-06-05 LAB — BASIC METABOLIC PANEL
ANION GAP: 6 (ref 5–15)
BUN: 8 mg/dL (ref 6–20)
CHLORIDE: 103 mmol/L (ref 101–111)
CO2: 28 mmol/L (ref 22–32)
CREATININE: 0.56 mg/dL (ref 0.44–1.00)
Calcium: 8.9 mg/dL (ref 8.9–10.3)
GFR calc non Af Amer: >60 mL/min (ref >60)
Glucose, Bld: 121 mg/dL — ABNORMAL HIGH (ref 65–99)
POTASSIUM: 3.9 mmol/L (ref 3.5–5.1)
SODIUM: 137 mmol/L (ref 135–145)

## 2017-06-05 LAB — CBC
HCT: 30.8 % — ABNORMAL LOW (ref 36.0–46.0)
HEMOGLOBIN: 9.9 g/dL — AB (ref 12.0–15.0)
MCH: 28.1 pg (ref 26.0–34.0)
MCHC: 32.1 g/dL (ref 30.0–36.0)
MCV: 87.5 fL (ref 78.0–100.0)
Platelets: 260 10*3/uL (ref 150–400)
RBC: 3.52 MIL/uL — AB (ref 3.87–5.11)
RDW: 14.7 % (ref 11.5–15.5)
WBC: 12.3 10*3/uL — AB (ref 4.0–10.5)

## 2017-06-05 MED ORDER — NON FORMULARY
40.0000 mg | Freq: Every day | Status: DC | PRN
Start: 2017-06-05 — End: 2017-06-05

## 2017-06-05 MED ORDER — OMEPRAZOLE 20 MG PO CPDR: 40.0000 mg | DELAYED_RELEASE_CAPSULE | Freq: Every day | ORAL | Status: DC | PRN

## 2017-06-05 MED ORDER — METHOCARBAMOL 500 MG PO TABS: 500.0000 mg | ORAL_TABLET | Freq: Four times a day (QID) | ORAL | 0 refills | Status: DC | PRN

## 2017-06-05 MED ORDER — TRAMADOL HCL 50 MG PO TABS: 50.0000 mg | ORAL_TABLET | Freq: Four times a day (QID) | ORAL | 0 refills | Status: DC | PRN

## 2017-06-05 MED ORDER — HYDROMORPHONE HCL 2 MG PO TABS: 2.0000 mg | ORAL_TABLET | ORAL | Status: DC | PRN

## 2017-06-05 MED ORDER — HYDROMORPHONE HCL 2 MG PO TABS: 2.0000 mg | ORAL_TABLET | ORAL | 0 refills | Status: DC | PRN

## 2017-06-05 MED ORDER — RIVAROXABAN 10 MG PO TABS: 10.0000 mg | ORAL_TABLET | Freq: Every day | ORAL | 0 refills | Status: DC

## 2017-06-05 NOTE — Evaluation (Addendum)
Physical Therapy Evaluation Patient Details Name: Gentri Guardado MRN: [id number] DOB: [birth year] Today's Date: 06/05/2017   History of Present Illness  61 yo female s/p R TKA 06/04/17. Hx of L TKA  Clinical Impression  On eval, pt required Min guard assist for mobility. Pt walked ~115 feet with a RW. Pain rated 4/10. Pt experiending nausea and vomiting this morning. Will follow and progress activity as tolerated.     Follow Up Recommendations DC plan and follow up therapy as arranged by surgeon (HHPT then transition to OP PT)    Equipment Recommendations  Rolling walker with 5" wheels    Recommendations for Other Services       Precautions / Restrictions Precautions Precautions: Fall;Knee Required Braces or Orthoses: Knee Immobilizer - Right Knee Immobilizer - Right: Discontinue once straight leg raise with < 10 degree lag Restrictions Weight Bearing Restrictions: No RLE Weight Bearing: Weight bearing as tolerated      Mobility  Bed Mobility               General bed mobility comments: oob in recliner  Transfers Overall transfer level: Needs assistance Equipment used: Rolling walker (2 wheeled) Transfers: Sit to/from Stand Sit to Stand: Min guard         General transfer comment: close guard for safety. VCs safety, technique, hand/LE placement. Mildly uncontrolled descent  Ambulation/Gait Ambulation/Gait assistance: Min guard Ambulation Distance (Feet): 115 Feet Assistive device: Rolling walker (2 wheeled) Gait Pattern/deviations: Step-to pattern;Antalgic     General Gait Details: close guard for safety, VCs safety, technique, sequence. slow gait speed.  Stairs            Wheelchair Mobility    Modified Rankin (Stroke Patients Only)       Balance                                             Pertinent Vitals/Pain Pain Assessment: 0-10 Pain Score: 4  Pain Location: R knee Pain Descriptors / Indicators: Aching;Sore Pain  Intervention(s): Limited activity within patient's tolerance;Repositioned;Ice applied    Home Living Family/patient expects to be discharged to:: Private residence Living Arrangements: Spouse/significant other Available Help at Discharge: Family Type of Home: House Home Access: Stairs to enter Entrance Stairs-Rails: Right Entrance Stairs-Number of Steps: 3 Home Layout: One level        Prior Function Level of Independence: Independent               Hand Dominance        Extremity/Trunk Assessment   Upper Extremity Assessment Upper Extremity Assessment: Defer to OT evaluation    Lower Extremity Assessment Lower Extremity Assessment: Generalized weakness (s/p R TKA)    Cervical / Trunk Assessment Cervical / Trunk Assessment: Normal  Communication   Communication: No difficulties  Cognition Arousal/Alertness: Awake/alert Behavior During Therapy: WFL for tasks assessed/performed Overall Cognitive Status: Within Functional Limits for tasks assessed                                        General Comments      Exercises Total Joint Exercises Ankle Circles/Pumps: AROM;Both;10 reps;Supine Quad Sets: AROM;Both;10 reps;Supine Heel Slides: AAROM;Right;10 reps;Supine Hip ABduction/ADduction: AAROM;Right;10 reps;Supine Straight Leg Raises: Right;10 reps;Supine;AAROM Goniometric ROM: ~10-60 degrees   Assessment/Plan    PT  Assessment Patient needs continued PT services  PT Problem List Decreased strength;Decreased mobility;Decreased range of motion;Decreased activity tolerance;Decreased balance;Decreased knowledge of use of DME;Pain;Decreased knowledge of precautions       PT Treatment Interventions DME instruction;Therapeutic activities;Gait training;Therapeutic exercise;Stair training;Functional mobility training;Patient/family education    PT Goals (Current goals can be found in the Care Plan section)  Acute Rehab PT Goals Patient Stated Goal:  regain PLOF PT Goal Formulation: With patient Time For Goal Achievement: 06/19/17 Potential to Achieve Goals: Good    Frequency 7X/week   Barriers to discharge        Co-evaluation               AM-PAC PT "6 Clicks" Daily Activity  Outcome Measure Difficulty turning over in bed (including adjusting bedclothes, sheets and blankets)?: A Little Difficulty moving from lying on back to sitting on the side of the bed? : A Little Difficulty sitting down on and standing up from a chair with arms (e.g., wheelchair, bedside commode, etc,.)?: A Little Help needed moving to and from a bed to chair (including a wheelchair)?: A Little Help needed walking in hospital room?: A Little Help needed climbing 3-5 steps with a railing? : A Little 6 Click Score: 18    End of Session Equipment Utilized During Treatment: Gait belt;Right knee immobilizer Activity Tolerance: Patient tolerated treatment well Patient left: in chair;with call bell/phone within reach   PT Visit Diagnosis: Muscle weakness (generalized) (M62.81);Difficulty in walking, not elsewhere classified (R26.2)    Time: 1308-65781023-1042 PT Time Calculation (min) (ACUTE ONLY): 19 min   Charges:   PT Evaluation $PT Eval Low Complexity: 1 Procedure     PT G Codes:          Rebeca AlertJannie Agam Tuohy, MPT Pager: [phone]

## 2017-06-05 NOTE — Evaluation (Signed)
Occupational Therapy Evaluation Patient Details Name: Samantha Benton MRN: [id number] DOB: 1956/08/12 Today's Date: 06/05/2017    History of Present Illness s/p R TKA   Clinical Impression   This 61 year old female was admitted for the above sx. All education was completed. No further OT is needed at this time    Follow Up Recommendations  No OT follow up;Supervision/Assistance - 24 hour    Equipment Recommendations  None recommended by OT    Recommendations for Other Services       Precautions / Restrictions Precautions Precautions: Fall;Knee Required Braces or Orthoses: Knee Immobilizer - Right Knee Immobilizer - Right: Discontinue once straight leg raise with < 10 degree lag Restrictions Weight Bearing Restrictions: No RLE Weight Bearing: Weight bearing as tolerated      Mobility Bed Mobility               General bed mobility comments: oob in recliner  Transfers Overall transfer level: Needs assistance Equipment used: Rolling walker (2 wheeled) Transfers: Sit to/from Stand Sit to Stand: Supervision         General transfer comment: for safety    Balance                                           ADL either performed or assessed with clinical judgement   ADL Overall ADL's : Needs assistance/impaired     Grooming: Wash/dry hands;Supervision/safety;Standing   Upper Body Bathing: Set up;Sitting   Lower Body Bathing: Minimal assistance;Sit to/from stand   Upper Body Dressing : Set up;Sitting   Lower Body Dressing: Minimal assistance;Sit to/from stand   Toilet Transfer: Supervision/safety;Ambulation;BSC;RW             General ADL Comments: educated on tub bench vs readiness.  Pt will look into this herself. She has assist for adls as needed at home.  Pt demonstrates good safety awareness.  Reinforced knee precautions     Vision         Perception     Praxis      Pertinent Vitals/Pain Pain Assessment: No/denies  pain Pain Score: 4  Pain Location: R knee Pain Descriptors / Indicators: Aching;Sore Pain Intervention(s): Limited activity within patient's tolerance;Monitored during session;Premedicated before session;Repositioned;Ice applied     Hand Dominance     Extremity/Trunk Assessment Upper Extremity Assessment Upper Extremity Assessment: Overall WFL for tasks assessed      Cervical / Trunk Assessment Cervical / Trunk Assessment: Normal   Communication Communication Communication: No difficulties   Cognition Arousal/Alertness: Awake/alert Behavior During Therapy: WFL for tasks assessed/performed Overall Cognitive Status: Within Functional Limits for tasks assessed                                     General Comments       Exercises    Shoulder Instructions      Home Living Family/patient expects to be discharged to:: Private residence Living Arrangements: Spouse/significant other Available Help at Discharge: Family Type of Home: House Home Access: Stairs to enter Secretary/administrator of Steps: 3 Entrance Stairs-Rails: Right Home Layout: One level     Bathroom Shower/Tub: Chief Strategy Officer: Standard     Home Equipment: Bedside commode          Prior Functioning/Environment Level of Independence: Independent  OT Problem List:        OT Treatment/Interventions:      OT Goals(Current goals can be found in the care plan section) Acute Rehab OT Goals Patient Stated Goal: regain PLOF OT Goal Formulation: All assessment and education complete, DC therapy  OT Frequency:     Barriers to D/C:            Co-evaluation              AM-PAC PT "6 Clicks" Daily Activity     Outcome Measure Help from another person eating meals?: None Help from another person taking care of personal grooming?: A Little Help from another person toileting, which includes using toliet, bedpan, or urinal?: A Little Help  from another person bathing (including washing, rinsing, drying)?: A Little Help from another person to put on and taking off regular upper body clothing?: A Little Help from another person to put on and taking off regular lower body clothing?: A Little 6 Click Score: 19   End of Session CPM Right Knee CPM Right Knee: Off Additional Comments: 4 hrs per day. increase by 10 degrees per day  Activity Tolerance: Patient tolerated treatment well Patient left: in chair;with call bell/phone within reach;with family/visitor present  OT Visit Diagnosis: Pain Pain - Right/Left: Right Pain - part of body: Knee                Time: [id number] OT Time Calculation (min): 22 min Charges:  OT General Charges $OT Visit: 1 Procedure OT Evaluation $OT Eval Low Complexity: 1 Procedure G-Codes:     MontaquaMaryellen Samantha Benton, OTR/L [phone] 06/05/2017  Samantha Benton 06/05/2017, 1:05 PM

## 2017-06-05 NOTE — Progress Notes (Signed)
Discharge plan:  Pt plans to DC home with HHPT. Chose [facility] when given choice and [facility] rep aware of referral. Pt states she has a rolling walker at home and believes she has a 3in1 but will check with her husband.  Sandford Crazeora Kadden Osterhout RN,BSN,NCM [phone]

## 2017-06-05 NOTE — Progress Notes (Signed)
Physical Therapy Treatment Patient Details Name: Samantha BoerDebora Rothe MRN: [id number] DOB: [birth year] Today's Date: 06/05/2017    History of Present Illness s/p R TKA    PT Comments    Progressing with mobility.    Follow Up Recommendations  DC plan and follow up therapy as arranged by surgeon     Equipment Recommendations  Rolling walker with 5" wheels    Recommendations for Other Services       Precautions / Restrictions Precautions Precautions: Fall;Knee Required Braces or Orthoses: Knee Immobilizer - Left Knee Immobilizer - Right: Discontinue once straight leg raise with < 10 degree lag Restrictions Weight Bearing Restrictions: No RLE Weight Bearing: Weight bearing as tolerated    Mobility  Bed Mobility Overal bed mobility: Needs Assistance Bed Mobility: Supine to Sit;Sit to Supine     Supine to sit: Min assist Sit to supine: Min assist   General bed mobility comments: assist for R LE.   Transfers Overall transfer level: Needs assistance Equipment used: Rolling walker (2 wheeled) Transfers: Sit to/from Stand Sit to Stand: Min assist         General transfer comment: Assist to rise from surface that does not have armrests. Increased time.   Ambulation/Gait Ambulation/Gait assistance: Min guard Ambulation Distance (Feet): 135 Feet Assistive device: Rolling walker (2 wheeled) Gait Pattern/deviations: Step-through pattern;Decreased stride length     General Gait Details: close guard for safety. slow gait speed.   Stairs            Wheelchair Mobility    Modified Rankin (Stroke Patients Only)       Balance                                            Cognition Arousal/Alertness: Awake/alert Behavior During Therapy: WFL for tasks assessed/performed Overall Cognitive Status: Within Functional Limits for tasks assessed                                        Exercises      General Comments         Pertinent Vitals/Pain Pain Assessment: 0-10 Pain Score: 2  Pain Location: R knee Pain Descriptors / Indicators: Aching;Sore Pain Intervention(s): Limited activity within patient's tolerance;Repositioned;Ice applied    Home Living Family/patient expects to be discharged to:: Private residence Living Arrangements: Spouse/significant other Available Help at Discharge: Family         Home Equipment: Bedside commode      Prior Function Level of Independence: Independent          PT Goals (current goals can now be found in the care plan section) Acute Rehab PT Goals Patient Stated Goal: regain PLOF Progress towards PT goals: Progressing toward goals    Frequency    7X/week      PT Plan Current plan remains appropriate    Co-evaluation              AM-PAC PT "6 Clicks" Daily Activity  Outcome Measure  Difficulty turning over in bed (including adjusting bedclothes, sheets and blankets)?: A Little Difficulty moving from lying on back to sitting on the side of the bed? : A Little Difficulty sitting down on and standing up from a chair with arms (e.g., wheelchair, bedside commode, etc,.)?: A Little Help needed moving  to and from a bed to chair (including a wheelchair)?: A Little Help needed walking in hospital room?: A Little Help needed climbing 3-5 steps with a railing? : A Little 6 Click Score: 18    End of Session Equipment Utilized During Treatment: Gait belt;Right knee immobilizer Activity Tolerance: Patient tolerated treatment well Patient left: in bed;with call bell/phone within reach   PT Visit Diagnosis: Muscle weakness (generalized) (M62.81);Difficulty in walking, not elsewhere classified (R26.2)     Time: 1340-1350 PT Time Calculation (min) (ACUTE ONLY): 10 min  Charges:  $Gait Training: 8-22 mins                    G Codes:          Rebeca Alert, MPT Pager: [phone]

## 2017-06-05 NOTE — Progress Notes (Signed)
   Subjective: 1 Day Post-Op Procedure(s) (LRB): RIGHT TOTAL KNEE ARTHROPLASTY (Right) Patient reports pain as mild and moderate.   Patient seen in rounds for Dr. Lequita HaltAluisio this morning on rounds. She has some nausea the night of surgery and did not get much sleep. Patient is well, but has had some minor complaints of pain in the knee, requiring pain medications Started therapy today. Walked 115 feet and then 135 feet on day one. Plan is to go Home after hospital stay.  Objective: Vital signs in last 24 hours: Temp:  [98.9 F (37.2 C)-99.9 F (37.7 C)] 99.9 F (37.7 C) (06/12 1411) Pulse Rate:  [75-95] 95 (06/12 1411) Resp:  [15-16] 16 (06/12 1411) BP: (113-146)/(54-73) 135/63 (06/12 1411) SpO2:  [96 %-100 %] 96 % (06/12 1411)  Intake/Output from previous day:  Intake/Output Summary (Last 24 hours) at 06/05/17 2032 Last data filed at 06/05/17 1411  Gross per 24 hour  Intake           1802.5 ml  Output             2055 ml  Net           -252.5 ml    Intake/Output this shift: No intake/output data recorded.  Labs:  Recent Labs  06/04/17 1503 06/05/17 0520  HGB 10.6* 9.9*    Recent Labs  06/04/17 1503 06/05/17 0520  WBC 14.0* 12.3*  RBC 3.83* 3.52*  HCT 34.1* 30.8*  PLT 299 260    Recent Labs  06/05/17 0520  NA 137  K 3.9  CL 103  CO2 28  BUN 8  CREATININE 0.56  GLUCOSE 121*  CALCIUM 8.9   No results for input(s): LABPT, INR in the last 72 hours.  EXAM General - Patient is Alert, Appropriate and Oriented Extremity - Neurovascular intact Sensation intact distally Intact pulses distally Dorsiflexion/Plantar flexion intact Dressing - dressing C/D/I Motor Function - intact, moving foot and toes well on exam.  Hemovac pulled without difficulty.  Past Medical History:  Diagnosis Date  . Anemia    hx. of  . Anxiety    pt. denies at preop appt.  . Arthritis    right knee  . Family history of adverse reaction to anesthesia    daughter woke up  during surgery  . GERD (gastroesophageal reflux disease)   . PONV (postoperative nausea and vomiting)     Assessment/Plan: 1 Day Post-Op Procedure(s) (LRB): RIGHT TOTAL KNEE ARTHROPLASTY (Right) Principal Problem:   OA (osteoarthritis) of knee  Estimated body mass index is 27.54 kg/m as calculated from the following:   Height as of this encounter: 5' (1.524 m).   Weight as of this encounter: 64 kg (141 lb). Up with therapy Discharge home with home health  DVT Prophylaxis - Xarelto Weight-Bearing as tolerated to right leg D/C O2 and Pulse OX and try on Room Air  Avel Peacerew Dylan Ruotolo, PA-C Orthopaedic Surgery 06/05/2017, 8:32 PM

## 2017-06-05 NOTE — Discharge Summary (Signed)
Physician Discharge Summary   Patient ID: Samantha Benton MRN: [id number] DOB/AGE: 02-08-1956 61 y.o.  Admit date: 06/04/2017 Discharge date: 06/06/2017  Primary Diagnosis:  Osteoarthritis  Right knee(s) Admission Diagnoses:  Past Medical History:  Diagnosis Date  . Anemia    hx. of  . Anxiety    pt. denies at preop appt.  . Arthritis    right knee  . Family history of adverse reaction to anesthesia    daughter woke up during surgery  . GERD (gastroesophageal reflux disease)   . PONV (postoperative nausea and vomiting)    Discharge Diagnoses:   Principal Problem:   OA (osteoarthritis) of knee  Estimated body mass index is 27.54 kg/m as calculated from the following:   Height as of this encounter: 5' (1.524 m).   Weight as of this encounter: 64 kg (141 lb).  Procedure:  Procedure(s) (LRB): RIGHT TOTAL KNEE ARTHROPLASTY (Right)   Consults: None  HPI: Samantha Benton is a 61 y.o. year old female with end stage OA of her right knee with progressively worsening pain and dysfunction. She has constant pain, with activity and at rest and significant functional deficits with difficulties even with ADLs. She has had extensive non-op management including analgesics, injections of cortisone and viscosupplements, and home exercise program, but remains in significant pain with significant dysfunction.Radiographs show bone on bone arthritis medial and patellofemoral. She presents now for right Total Knee Arthroplasty.   Laboratory Data: Admission on 06/04/2017  Component Date Value Ref Range Status  . WBC 06/04/2017 14.0* 4.0 - 10.5 K/uL Final  . RBC 06/04/2017 3.83* 3.87 - 5.11 MIL/uL Final  . Hemoglobin 06/04/2017 10.6* 12.0 - 15.0 g/dL Final  . HCT 06/04/2017 34.1* 36.0 - 46.0 % Final  . MCV 06/04/2017 89.0  78.0 - 100.0 fL Final  . MCH 06/04/2017 27.7  26.0 - 34.0 pg Final  . MCHC 06/04/2017 31.1  30.0 - 36.0 g/dL Final  . RDW 06/04/2017 15.0  11.5 - 15.5 % Final  . Platelets  06/04/2017 299  150 - 400 K/uL Final  . WBC 06/05/2017 12.3* 4.0 - 10.5 K/uL Final  . RBC 06/05/2017 3.52* 3.87 - 5.11 MIL/uL Final  . Hemoglobin 06/05/2017 9.9* 12.0 - 15.0 g/dL Final  . HCT 06/05/2017 30.8* 36.0 - 46.0 % Final  . MCV 06/05/2017 87.5  78.0 - 100.0 fL Final  . MCH 06/05/2017 28.1  26.0 - 34.0 pg Final  . MCHC 06/05/2017 32.1  30.0 - 36.0 g/dL Final  . RDW 06/05/2017 14.7  11.5 - 15.5 % Final  . Platelets 06/05/2017 260  150 - 400 K/uL Final  . Sodium 06/05/2017 137  135 - 145 mmol/L Final  . Potassium 06/05/2017 3.9  3.5 - 5.1 mmol/L Final  . Chloride 06/05/2017 103  101 - 111 mmol/L Final  . CO2 06/05/2017 28  22 - 32 mmol/L Final  . Glucose, Bld 06/05/2017 121* 65 - 99 mg/dL Final  . BUN 06/05/2017 8  6 - 20 mg/dL Final  . Creatinine, Ser 06/05/2017 0.56  0.44 - 1.00 mg/dL Final  . Calcium 06/05/2017 8.9  8.9 - 10.3 mg/dL Final  . GFR calc non Af Amer 06/05/2017 >60  >60 mL/min Final  . GFR calc Af Amer 06/05/2017 >60  >60 mL/min Final   Comment: (NOTE) The eGFR has been calculated using the CKD EPI equation. This calculation has not been validated in all clinical situations. eGFR's persistently <60 mL/min signify possible Chronic Kidney Disease.   Georgiann Hahn  gap 06/05/2017 6  5 - 15 Final  Hospital Outpatient Visit on 06/01/2017  Component Date Value Ref Range Status  . WBC 06/01/2017 8.0  4.0 - 10.5 K/uL Final  . RBC 06/01/2017 4.02  3.87 - 5.11 MIL/uL Final  . Hemoglobin 06/01/2017 11.5* 12.0 - 15.0 g/dL Final  . HCT 06/01/2017 36.2  36.0 - 46.0 % Final  . MCV 06/01/2017 90.0  78.0 - 100.0 fL Final  . MCH 06/01/2017 28.6  26.0 - 34.0 pg Final  . MCHC 06/01/2017 31.8  30.0 - 36.0 g/dL Final  . RDW 06/01/2017 15.1  11.5 - 15.5 % Final  . Platelets 06/01/2017 312  150 - 400 K/uL Final  . aPTT 06/01/2017 33  24 - 36 seconds Final  . Sodium 06/01/2017 142  135 - 145 mmol/L Final  . Potassium 06/01/2017 4.2  3.5 - 5.1 mmol/L Final  . Chloride 06/01/2017 108  101  - 111 mmol/L Final  . CO2 06/01/2017 28  22 - 32 mmol/L Final  . Glucose, Bld 06/01/2017 101* 65 - 99 mg/dL Final  . BUN 06/01/2017 20  6 - 20 mg/dL Final  . Creatinine, Ser 06/01/2017 0.60  0.44 - 1.00 mg/dL Final  . Calcium 06/01/2017 9.3  8.9 - 10.3 mg/dL Final  . Total Protein 06/01/2017 6.9  6.5 - 8.1 g/dL Final  . Albumin 06/01/2017 3.5  3.5 - 5.0 g/dL Final  . AST 06/01/2017 16  15 - 41 U/L Final  . ALT 06/01/2017 14  14 - 54 U/L Final  . Alkaline Phosphatase 06/01/2017 91  38 - 126 U/L Final  . Total Bilirubin 06/01/2017 0.2* 0.3 - 1.2 mg/dL Final  . GFR calc non Af Amer 06/01/2017 >60  >60 mL/min Final  . GFR calc Af Amer 06/01/2017 >60  >60 mL/min Final   Comment: (NOTE) The eGFR has been calculated using the CKD EPI equation. This calculation has not been validated in all clinical situations. eGFR's persistently <60 mL/min signify possible Chronic Kidney Disease.   . Anion gap 06/01/2017 6  5 - 15 Final  . Prothrombin Time 06/01/2017 13.0  11.4 - 15.2 seconds Final  . INR 06/01/2017 0.98   Final  . ABO/RH(D) 06/01/2017 O POS   Final  . Antibody Screen 06/01/2017 NEG   Final  . Sample Expiration 06/01/2017 06/07/2017   Final  . Extend sample reason 06/01/2017 NO TRANSFUSIONS OR PREGNANCY IN THE PAST 3 MONTHS   Final  . MRSA, PCR 06/01/2017 NEGATIVE  NEGATIVE Final  . Staphylococcus aureus 06/01/2017 NEGATIVE  NEGATIVE Final   Comment:        The Xpert SA Assay (FDA approved for NASAL specimens in patients over 16 years of age), is one component of a comprehensive surveillance program.  Test performance has been validated by [facility] for patients greater than or equal to 34 year old. It is not intended to diagnose infection nor to guide or monitor treatment.   . ABO/RH(D) 06/01/2017 O POS   Final     X-Rays:No results found.  EKG: Orders placed or performed in visit on 11/04/14  . EKG 12-Lead     Hospital Course: Samantha Benton is a 61 y.o. who was  admitted to [facility]. They were brought to the operating room on 06/04/2017 and underwent Procedure(s): RIGHT TOTAL KNEE ARTHROPLASTY.  Patient tolerated the procedure well and was later transferred to the recovery room and then to the orthopaedic floor for postoperative care.  They were  given PO and IV analgesics for pain control following their surgery.  They were given 24 hours of postoperative antibiotics of  Anti-infectives    Start     Dose/Rate Route Frequency Ordered Stop   06/04/17 2300  vancomycin (VANCOCIN) IVPB 1000 mg/200 mL premix     1,000 mg 200 mL/hr over 60 Minutes Intravenous Every 12 hours 06/04/17 1425 06/05/17 0035   06/04/17 0945  vancomycin (VANCOCIN) IVPB 1000 mg/200 mL premix     1,000 mg 200 mL/hr over 60 Minutes Intravenous On call to O.R. 06/04/17 0945 06/04/17 1135     and started on DVT prophylaxis in the form of Xarelto.   PT and OT were ordered for total joint protocol.  Discharge planning consulted to help with postop disposition and equipment needs.  Patient had a decnt night on the evening of surgery.  They started to get up OOB with therapy on day one. Walked 115 feet and then 135 feet on day one. Hemovac drain was pulled without difficulty.  Continued to work with therapy into day two.  Dressing was changed on day two and the incision was healing well. Patient was seen in rounds and was ready to go home.  Discharge home with home health Diet - Regular diet Follow up - in 2 weeks Activity - WBAT Disposition - Home Condition Upon Discharge - Good D/C Meds - See DC Summary DVT Prophylaxis - Xarelto   Discharge Instructions    Call MD / Call 911    Complete by:  As directed    If you experience chest pain or shortness of breath, CALL 911 and be transported to the hospital emergency room.  If you develope a fever above 101 F, pus (white drainage) or increased drainage or redness at the wound, or calf pain, call your surgeon's office.   Change  dressing    Complete by:  As directed    Change dressing daily with sterile 4 x 4 inch gauze dressing and apply TED hose. Do not submerge the incision under water.   Constipation Prevention    Complete by:  As directed    Drink plenty of fluids.  Prune juice may be helpful.  You may use a stool softener, such as Colace (over the counter) 100 mg twice a day.  Use MiraLax (over the counter) for constipation as needed.   Diet - low sodium heart healthy    Complete by:  As directed    Discharge instructions    Complete by:  As directed    Take Xarelto for two and a half more weeks, then discontinue Xarelto. Once the patient has completed the blood thinner regimen, then take a Baby 81 mg Aspirin daily for three more weeks.   Pick up stool softner and laxative for home use following surgery while on pain medications. Do not submerge incision under water. Please use good hand washing techniques while changing dressing each day. May shower starting three days after surgery. Please use a clean towel to pat the incision dry following showers. Continue to use ice for pain and swelling after surgery. Do not use any lotions or creams on the incision until instructed by your surgeon.  Wear both TED hose on both legs during the day every day for three weeks, but may remove the TED hose at night at home.  Postoperative Constipation Protocol  Constipation - defined medically as fewer than three stools per week and severe constipation as less than one stool per week.  One of the most common issues patients have following surgery is constipation.  Even if you have a regular bowel pattern at home, your normal regimen is likely to be disrupted due to multiple reasons following surgery.  Combination of anesthesia, postoperative narcotics, change in appetite and fluid intake all can affect your bowels.  In order to avoid complications following surgery, here are some recommendations in order to help you during  your recovery period.  Colace (docusate) - Pick up an over-the-counter form of Colace or another stool softener and take twice a day as long as you are requiring postoperative pain medications.  Take with a full glass of water daily.  If you experience loose stools or diarrhea, hold the colace until you stool forms back up.  If your symptoms do not get better within 1 week or if they get worse, check with your doctor.  Dulcolax (bisacodyl) - Pick up over-the-counter and take as directed by the product packaging as needed to assist with the movement of your bowels.  Take with a full glass of water.  Use this product as needed if not relieved by Colace only.   MiraLax (polyethylene glycol) - Pick up over-the-counter to have on hand.  MiraLax is a solution that will increase the amount of water in your bowels to assist with bowel movements.  Take as directed and can mix with a glass of water, juice, soda, coffee, or tea.  Take if you go more than two days without a movement. Do not use MiraLax more than once per day. Call your doctor if you are still constipated or irregular after using this medication for 7 days in a row.  If you continue to have problems with postoperative constipation, please contact the office for further assistance and recommendations.  If you experience "the worst abdominal pain ever" or develop nausea or vomiting, please contact the office immediatly for further recommendations for treatment.   Do not put a pillow under the knee. Place it under the heel.    Complete by:  As directed    Do not sit on low chairs, stoools or toilet seats, as it may be difficult to get up from low surfaces    Complete by:  As directed    Driving restrictions    Complete by:  As directed    No driving until released by the physician.   Increase activity slowly as tolerated    Complete by:  As directed    Lifting restrictions    Complete by:  As directed    No lifting until released by the  physician.   Patient may shower    Complete by:  As directed    You may shower without a dressing once there is no drainage.  Do not wash over the wound.  If drainage remains, do not shower until drainage stops.   TED hose    Complete by:  As directed    Use stockings (TED hose) for 3 weeks on both leg(s).  You may remove them at night for sleeping.   Weight bearing as tolerated    Complete by:  As directed    Laterality:  right   Extremity:  Lower      Follow-up Information    Home, [facility] Follow up.   Specialty:  [facility] Contact information: [place] [address] [phone]        Gaynelle Arabian, MD. Schedule an appointment as soon as  possible for a visit on 06/19/2017.   Specialty:  Orthopedic Surgery Contact information: [address] [place] [address] [phone]           Signed: Arlee Muslim, PA-C Orthopaedic Surgery 06/05/2017, 8:40 PM

## 2017-06-06 LAB — BASIC METABOLIC PANEL
ANION GAP: 8 (ref 5–15)
BUN: 14 mg/dL (ref 6–20)
CO2: 28 mmol/L (ref 22–32)
Calcium: 8.9 mg/dL (ref 8.9–10.3)
Chloride: 105 mmol/L (ref 101–111)
Creatinine, Ser: 0.51 mg/dL (ref 0.44–1.00)
Glucose, Bld: 94 mg/dL (ref 65–99)
POTASSIUM: 3.5 mmol/L (ref 3.5–5.1)
SODIUM: 141 mmol/L (ref 135–145)

## 2017-06-06 LAB — CBC
HCT: 29.6 % — ABNORMAL LOW (ref 36.0–46.0)
Hemoglobin: 9.5 g/dL — ABNORMAL LOW (ref 12.0–15.0)
MCH: 27.9 pg (ref 26.0–34.0)
MCHC: 32.1 g/dL (ref 30.0–36.0)
MCV: 86.8 fL (ref 78.0–100.0)
PLATELETS: 236 10*3/uL (ref 150–400)
RBC: 3.41 MIL/uL — AB (ref 3.87–5.11)
RDW: 14.8 % (ref 11.5–15.5)
WBC: 12.6 10*3/uL — AB (ref 4.0–10.5)

## 2017-06-06 NOTE — Progress Notes (Signed)
Pt states she does have a 3in1 at home, so has all equipment needed. Sandford Crazeora Jahari Wiginton RN,BSN,NCM [phone]

## 2017-06-06 NOTE — Progress Notes (Signed)
Physical Therapy Treatment Patient Details Name: Samantha Benton MRN: [id number] DOB: [birth year] Today's Date: 06/06/2017    History of Present Illness s/p R TKA    PT Comments    Progressing well with mobility. Reviewed/practiced exercises, gait training, and stair training. All education completed. Ready to d/c from PT standpoint-made RN aware.    Follow Up Recommendations  DC plan and follow up therapy as arranged by surgeon     Equipment Recommendations  Rolling walker with 5" wheels    Recommendations for Other Services       Precautions / Restrictions Precautions Precautions: Fall;Knee Required Braces or Orthoses: Knee Immobilizer - Right Knee Immobilizer - Right: Discontinue once straight leg raise with < 10 degree lag Restrictions Weight Bearing Restrictions: No RLE Weight Bearing: Weight bearing as tolerated    Mobility  Bed Mobility Overal bed mobility: Needs Assistance Bed Mobility: Sit to Supine       Sit to supine: Min assist   General bed mobility comments: assist for R LE.   Transfers Overall transfer level: Needs assistance Equipment used: Rolling walker (2 wheeled) Transfers: Sit to/from Stand Sit to Stand: Min guard         General transfer comment: close guard for safety.   Ambulation/Gait Ambulation/Gait assistance: Min guard Ambulation Distance (Feet): 125 Feet Assistive device: Rolling walker (2 wheeled) Gait Pattern/deviations: Step-through pattern;Decreased stride length;Antalgic     General Gait Details: close guard for safety.    Stairs Stairs: Yes   Stair Management: Step to pattern;Forwards;One rail Right Number of Stairs: 2 General stair comments: 1 rail, 1 HHA. VCs safety, technique, sequence.   Wheelchair Mobility    Modified Rankin (Stroke Patients Only)       Balance                                            Cognition Arousal/Alertness: Awake/alert Behavior During Therapy: WFL for  tasks assessed/performed Overall Cognitive Status: Within Functional Limits for tasks assessed                                        Exercises Total Joint Exercises Ankle Circles/Pumps: AROM;Both;10 reps;Supine Quad Sets: AROM;Both;10 reps;Supine Heel Slides: AAROM;Right;10 reps;Supine Hip ABduction/ADduction: AAROM;Right;10 reps;Supine Straight Leg Raises: Right;10 reps;Supine;AAROM Goniometric ROM: ~10-55 degrees    General Comments        Pertinent Vitals/Pain Pain Assessment: 0-10 Pain Score: 5  Pain Location: R knee Pain Descriptors / Indicators: Aching;Sore Pain Intervention(s): Monitored during session;Repositioned;Ice applied    Home Living                      Prior Function            PT Goals (current goals can now be found in the care plan section) Progress towards PT goals: Progressing toward goals    Frequency    7X/week      PT Plan Current plan remains appropriate    Co-evaluation              AM-PAC PT "6 Clicks" Daily Activity  Outcome Measure  Difficulty turning over in bed (including adjusting bedclothes, sheets and blankets)?: A Little Difficulty moving from lying on back to sitting on the side of the bed? : A Little Difficulty  sitting down on and standing up from a chair with arms (e.g., wheelchair, bedside commode, etc,.)?: A Little Help needed moving to and from a bed to chair (including a wheelchair)?: A Little Help needed walking in hospital room?: A Little Help needed climbing 3-5 steps with a railing? : A Little 6 Click Score: 18    End of Session Equipment Utilized During Treatment: Gait belt;Right knee immobilizer Activity Tolerance: Patient tolerated treatment well Patient left: with call bell/phone within reach;in bed   PT Visit Diagnosis: Muscle weakness (generalized) (M62.81);Difficulty in walking, not elsewhere classified (R26.2)     Time: 1610-9604 PT Time Calculation (min) (ACUTE  ONLY): 26 min  Charges:  $Gait Training: 8-22 mins $Therapeutic Exercise: 8-22 mins                    G Codes:         Rebeca Alert, MPT Pager: [phone]

## 2017-06-06 NOTE — Progress Notes (Signed)
   Subjective: 2 Days Post-Op Procedure(s) (LRB): RIGHT TOTAL KNEE ARTHROPLASTY (Right) Patient reports pain as mild.   Patient seen in rounds by Dr. Lequita HaltAluisio. Patient is well, and has had no acute complaints or problems Patient is ready to go home   Objective: Vital signs in last 24 hours: Temp:  [98.9 F (37.2 C)-99.9 F (37.7 C)] 99.2 F (37.3 C) (06/13 0629) Pulse Rate:  [75-95] 88 (06/13 0629) Resp:  [15-17] 17 (06/13 0629) BP: (120-140)/(59-73) 140/73 (06/13 0629) SpO2:  [96 %-99 %] 98 % (06/13 0629)  Intake/Output from previous day:  Intake/Output Summary (Last 24 hours) at 06/06/17 0721 Last data filed at 06/06/17 0645  Gross per 24 hour  Intake              500 ml  Output             1250 ml  Net             -750 ml    Intake/Output this shift: No intake/output data recorded.  Labs:  Recent Labs  06/04/17 1503 06/05/17 0520 06/06/17 0457  HGB 10.6* 9.9* 9.5*    Recent Labs  06/05/17 0520 06/06/17 0457  WBC 12.3* 12.6*  RBC 3.52* 3.41*  HCT 30.8* 29.6*  PLT 260 236    Recent Labs  06/05/17 0520 06/06/17 0457  NA 137 141  K 3.9 3.5  CL 103 105  CO2 28 28  BUN 8 14  CREATININE 0.56 0.51  GLUCOSE 121* 94  CALCIUM 8.9 8.9   No results for input(s): LABPT, INR in the last 72 hours.  EXAM: General - Patient is Alert, Appropriate and Oriented Extremity - Neurovascular intact Sensation intact distally Intact pulses distally Dorsiflexion/Plantar flexion intact Incision - clean, dry, no drainage Motor Function - intact, moving foot and toes well on exam.   Assessment/Plan: 2 Days Post-Op Procedure(s) (LRB): RIGHT TOTAL KNEE ARTHROPLASTY (Right) Procedure(s) (LRB): RIGHT TOTAL KNEE ARTHROPLASTY (Right) Past Medical History:  Diagnosis Date  . Anemia    hx. of  . Anxiety    pt. denies at preop appt.  . Arthritis    right knee  . Family history of adverse reaction to anesthesia    daughter woke up during surgery  . GERD  (gastroesophageal reflux disease)   . PONV (postoperative nausea and vomiting)    Principal Problem:   OA (osteoarthritis) of knee  Estimated body mass index is 27.54 kg/m as calculated from the following:   Height as of this encounter: 5' (1.524 m).   Weight as of this encounter: 64 kg (141 lb). Up with therapy Discharge home with home health Diet - Regular diet Follow up - in 2 weeks Activity - WBAT Disposition - Home Condition Upon Discharge - Good D/C Meds - See DC Summary DVT Prophylaxis - Xarelto  Avel Peacerew Perkins, PA-C Orthopaedic Surgery 06/06/2017, 7:21 AM

## 2017-08-01 ENCOUNTER — Encounter: Payer: Self-pay | Admitting: Gastroenterology

## 2017-09-27 ENCOUNTER — Ambulatory Visit: Payer: BC Managed Care – PPO | Admitting: Gastroenterology

## 2018-05-17 ENCOUNTER — Ambulatory Visit
Admission: RE | Admit: 2018-05-17 | Discharge: 2018-05-17 | Disposition: A | Discharge status: Home / Self Care | Payer: BC Managed Care – PPO | Source: Ambulatory Visit | Attending: Family Medicine | Admitting: Family Medicine

## 2018-05-17 ENCOUNTER — Other Ambulatory Visit: Payer: Self-pay | Admitting: Family Medicine

## 2018-05-17 DIAGNOSIS — R05 Cough: Secondary | ICD-10-CM

## 2018-05-17 DIAGNOSIS — R053 Chronic cough: Secondary | ICD-10-CM

## 2019-05-23 ENCOUNTER — Other Ambulatory Visit: Payer: Self-pay | Admitting: Physician Assistant

## 2019-05-23 DIAGNOSIS — Z1231 Encounter for screening mammogram for malignant neoplasm of breast: Secondary | ICD-10-CM

## 2019-05-23 DIAGNOSIS — E2839 Other primary ovarian failure: Secondary | ICD-10-CM

## 2019-08-08 ENCOUNTER — Other Ambulatory Visit: Payer: Self-pay

## 2019-08-08 ENCOUNTER — Ambulatory Visit
Admission: RE | Admit: 2019-08-08 | Discharge: 2019-08-08 | Disposition: A | Discharge status: Home / Self Care | Payer: BC Managed Care – PPO | Source: Ambulatory Visit | Attending: Physician Assistant | Admitting: Physician Assistant

## 2019-08-08 DIAGNOSIS — Z1231 Encounter for screening mammogram for malignant neoplasm of breast: Secondary | ICD-10-CM

## 2019-08-08 DIAGNOSIS — E2839 Other primary ovarian failure: Secondary | ICD-10-CM

## 2019-08-22 ENCOUNTER — Encounter: Payer: Self-pay | Admitting: Neurology

## 2019-08-27 ENCOUNTER — Encounter: Payer: Self-pay | Admitting: Neurology

## 2019-10-08 ENCOUNTER — Encounter: Payer: Self-pay | Admitting: Neurology

## 2019-10-08 ENCOUNTER — Ambulatory Visit: Payer: BC Managed Care – PPO | Admitting: Neurology

## 2019-10-08 ENCOUNTER — Other Ambulatory Visit: Payer: Self-pay

## 2019-10-08 VITALS — BP 160/81 | HR 72 | Ht 60.0 in | Wt 150.1 lb

## 2019-10-08 DIAGNOSIS — G3184 Mild cognitive impairment, so stated: Secondary | ICD-10-CM

## 2019-10-08 DIAGNOSIS — R519 Headache, unspecified: Secondary | ICD-10-CM

## 2019-10-08 DIAGNOSIS — R413 Other amnesia: Secondary | ICD-10-CM | POA: Diagnosis not present

## 2019-10-08 NOTE — Progress Notes (Signed)
NEUROLOGY CONSULTATION NOTE  Samantha Benton MRN: [id number] DOB: 01-Nov-1956  Referring provider: Milus Height, PA-C Primary care provider: Milus Height, PA-C  Reason for consult:  Memory loss  Thank you for your kind referral of Samantha Benton for consultation of the above symptoms. Although her history is well known to you, please allow me to reiterate it for the purpose of our medical record. She is alone in the office today. Records and images were personally reviewed where available.  HISTORY OF PRESENT ILLNESS: This is a pleasant 63 year old right-handed woman with a history of GERD, left knee replacement, presenting for evaluation of memory loss. She feels her memory is "not too good." She started noticing changes around 3 months ago where she would forget conversations after she leaves the room. She would study the bible and forget what she had just read. She did not have this issue in the past. She would forget her grandchildren's name, she has 3 grandkids. She has a lot of word-finding difficulties. 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 She denies any difficulties at work, stating work was routine, however had previously reported to her PCP that she has forgotten things she has always done or done things in an order different from how she had always done them because she had forgotten to do something prior. She misplaces things sometimes. She has not left the stove on or forgotten recipes. She denies getting lost driving. She denies missing medications. Her husband has always managed finances. Her family has not noticed any memory issues. Her father was diagnosed with dementia in his late 86s. No history of significant head injuries or alcohol use.   Over the past 2-3 months, she has had headaches around once a week. Pain is usually on the left temporal region, sometimes the frontal region, with throbbing lasting a few minutes. No associated  nausea/vomiting, photo/phonophobia. She usually massages her head and symptoms ease off. She denies any dizziness, diplopia, dysarthria/dysphagia, neck/back pain, focal numbness/tingling, bowel/bladder dysfunction, anosmia, or tremors. No hallucinations. She continues to deal with left knee pain after surgery, which frustrates her. No falls. She reports mood is good. She usually sleeps 5 hours at night and feels tired in the day. She occasionally feels sleepy. Her husband has not mentioned any snoring.   Laboratory Data: TSH and B12 done at PCP office, results requested for review.   PAST MEDICAL HISTORY: Past Medical History:  Diagnosis Date  . Anemia    hx. of  . Anxiety    pt. denies at preop appt.  . Arthritis    right knee  . Family history of adverse reaction to anesthesia    daughter woke up during surgery  . GERD (gastroesophageal reflux disease)   . PONV (postoperative nausea and vomiting)     PAST SURGICAL HISTORY: Past Surgical History:  Procedure Laterality Date  . CARPAL TUNNEL RELEASE  2008  . CHOLECYSTECTOMY  1970  . gallstone removal    . KNEE ARTHROSCOPY  2002   right knee  . KNEE ARTHROTOMY Left 12/09/2014   Procedure: LEFT KNEE ARTHROTOMY WITH SCAR EXCISION;  Surgeon: Loanne Drilling, MD;  Location: [facility];  Service: Orthopedics;  Laterality: Left;  . KNEE CLOSED REDUCTION Left 05/12/2015   Procedure: LEFT KNEE CLOSED MANIPULATION ;  Surgeon: Ollen Gross, MD;  Location: [facility];  Service: Orthopedics;  Laterality: Left;  . LEFT HEART CATHETERIZATION WITH CORONARY ANGIOGRAM N/A 11/13/2014  Procedure: LEFT HEART CATHETERIZATION WITH CORONARY ANGIOGRAM;  Surgeon: Jettie Booze, MD;  Location: [facility] CATH [facility];  Service: Cardiovascular;  Laterality: N/A;  . TOTAL KNEE ARTHROPLASTY Right 06/04/2017   Procedure: RIGHT TOTAL KNEE ARTHROPLASTY;  Surgeon: Gaynelle Arabian, MD;  Location: [facility];  Service: Orthopedics;  Laterality: Right;  Adductor Block  . TUBAL LIGATION       MEDICATIONS: Current Outpatient Medications on File Prior to Visit  Medication Sig Dispense Refill  . omeprazole (PRILOSEC) 40 MG capsule Take 40 mg by mouth daily as needed. For acid reflux.    . meloxicam (MOBIC) 15 MG tablet Take 15 mg by mouth daily.     No current facility-administered medications on file prior to visit.     ALLERGIES: Allergies  Allergen Reactions  . Penicillins Hives and Swelling    Throat swelling Has patient had a PCN reaction causing immediate rash, facial/tongue/throat swelling, SOB or lightheadedness with hypotension: Yes Has patient had a PCN reaction causing severe rash involving mucus membranes or skin necrosis: No Has patient had a PCN reaction that required hospitalization: No Has patient had a PCN reaction occurring within the last 10 years: No If all of the above answers are "NO", then may proceed with Cephalosporin use.   . Nabumetone     HA, feet tingles    FAMILY HISTORY: Family History  Problem Relation Age of Onset  . Heart disease Mother 37       MI  . Cancer Mother 23       breast ca  . Deep vein thrombosis Father   . Heart disease Sister 27       MI    SOCIAL HISTORY: Social History   Socioeconomic History  . Marital status: Married    Spouse name: Not on file  . Number of children: Not on file  . Years of education: Not on file  . Highest education level: Not on file  Occupational History  . Occupation: Microbiologist: [employer]  . Financial resource strain: Not on file  . Food insecurity    Worry: Not on file    Inability: Not on file  . Transportation needs    Medical: Not on file    Non-medical: Not on file  Tobacco Use  . Smoking status: Never Smoker  . Smokeless tobacco: Never Used  Substance and Sexual Activity  . Alcohol use: No  . Drug use: No  . Sexual activity: Not Currently    Partners: Male  Lifestyle  . Physical activity    Days per week: Not on file     Minutes per session: Not on file  . Stress: Not on file  Relationships  . Social Herbalist on phone: Not on file    Gets together: Not on file    Attends religious service: Not on file    Active member of club or organization: Not on file    Attends meetings of clubs or organizations: Not on file    Relationship status: Not on file  . Intimate partner violence    Fear of current or ex partner: Not on file    Emotionally abused: Not on file    Physically abused: Not on file    Forced sexual activity: Not on file  Other Topics Concern  . Not on file  Social History Narrative   Lives with husband.  2 children- grown.  Works at [employer]  REVIEW OF SYSTEMS: Constitutional: No fevers, chills, or sweats, no generalized fatigue, change in appetite Eyes: No visual changes, double vision, eye pain Ear, nose and throat: No hearing loss, ear pain, nasal congestion, sore throat Cardiovascular: No chest pain, palpitations Respiratory:  No shortness of breath at rest or with exertion, wheezes GastrointestinaI: No nausea, vomiting, diarrhea, abdominal pain, fecal incontinence Genitourinary:  No dysuria, urinary retention or frequency Musculoskeletal:  No neck pain, back pain. +left knee pain Integumentary: No rash, pruritus, skin lesions Neurological: as above Psychiatric: No depression, insomnia, anxiety Endocrine: No palpitations, fatigue, diaphoresis, mood swings, change in appetite, change in weight, increased thirst Hematologic/Lymphatic:  No anemia, purpura, petechiae. Allergic/Immunologic: no itchy/runny eyes, nasal congestion, recent allergic reactions, rashes  PHYSICAL EXAM: Vitals:   10/08/19 0904  BP: (!) 160/81  Pulse: 72   General: No acute distress Head:  Normocephalic/atraumatic Skin/Extremities: No rash, no edema Neurological Exam: Mental status: alert and oriented to person, place, and time, no dysarthria or aphasia, Fund of knowledge is  appropriate.  Recent and remote memory are impaired.  Attention and concentration are reduced.    Able to name objects. SLUMS score 19/30  Cranial nerves: CN I: not tested CN II: pupils equal, round and reactive to light, visual fields intact CN III, IV, VI:  full range of motion, no nystagmus, no ptosis CN V: facial sensation intact CN VII: upper and lower face symmetric CN VIII: hearing intact to conversation CN IX, X: gag intact, uvula midline CN XI: sternocleidomastoid and trapezius muscles intact CN XII: tongue midline Bulk & Tone: normal, no fasciculations. Motor: 5/5 throughout with no pronator drift. Sensation: intact to light touch, cold, vibration and joint position sense. Intact pin on both UE, reports reduced pin on left LE below the knee.  No extinction to double simultaneous stimulation.  Romberg test negative Deep Tendon Reflexes: +2 on both UE, +1 both LE, no ankle clonus Plantar responses: downgoing bilaterally Cerebellar: no incoordination on finger to nose testing Gait: narrow-based and steady with slight favoring of left leg due to left knee pain, no ataxia Tremor: none  IMPRESSION: This is a pleasant 63 year old right-handed woman with a history of GERD, left knee replacement, presenting for evaluation of worsening memory over the past 3 months. She reports mostly forgetfulness but denies any difficulties with complex tasks. Neurological exam normal. SLUMS score today is 19/30, even though this is in the dementia range, clinically she does not meet criteria for dementia. We discussed different causes of memory loss, bloodwork from PCP will be requested for review. MRI brain without contrast will be ordered to assess for underlying structural abnormality, she has also been reporting weekly headaches for the past few months. Neurocognitive testing will be done to further evaluate memory complaints. We discussed the importance of control of vascular risk factors, physical  exercise, and brain stimulation exercises for brain health. Follow-up in 6 months, she knows to call for any changes.   Thank you for allowing me to participate in the care of this patient. Please do not hesitate to call for any questions or concerns.   Patrcia DollyKaren Arrielle Mcginn, M.D.  CC: Milus HeightNoelle Redmon, PA-C

## 2019-10-08 NOTE — Patient Instructions (Addendum)
1. Schedule MRI brain without contrast 2. Schedule Neurocognitive testing with Dr. Melvyn Novas 3. Follow-up in 6 months, call for any changes  You have been referred for a neurocognitive evaluation in our office.   The evaluation has two parts.   . The first part of the evaluation is a clinical interview with the neuropsychologist (Dr. Melvyn Novas or Dr. Nicole Kindred). Please bring someone with you to this appointment if possible, as it is helpful for the doctor to hear from both you and another adult who knows you well.   . The second part of the evaluation is testing with the doctor's technician Hinton Dyer or Maudie Mercury). The testing includes a variety of tasks- mostly question-and-answer, some paper-and-pencil. There is nothing you need to do to prepare for this appointment, but having a good night's sleep prior to the testing, taking medications as you normally would, and bringing eyeglasses and hearing aids (if you wear them), is advised. Please make sure that you wear a mask to the appointment.  Please note: We have to reserve several hours of the neuropsychologist's time and the psychometrician's time for your evaluation appointment. As such, please note that there is a No-Show fee of $100. If you are unable to attend any of your appointments, please contact our office as soon as possible to reschedule.    RECOMMENDATIONS FOR ALL PATIENTS WITH MEMORY PROBLEMS: 1. Continue to exercise (Recommend 30 minutes of walking everyday, or 3 hours every week) 2. Increase social interactions - continue going to [place] and enjoy social gatherings with friends and family 3. Eat healthy, avoid fried foods and eat more fruits and vegetables 4. Maintain adequate blood pressure, blood sugar, and blood cholesterol level. Reducing the risk of stroke and cardiovascular disease also helps promoting better memory. 5. Avoid stressful situations. Live a simple life and avoid aggravations. Organize your time and prepare for the next day in  anticipation. 6. Sleep well, avoid any interruptions of sleep and avoid any distractions in the bedroom that may interfere with adequate sleep quality 7. Avoid sugar, avoid sweets as there is a strong link between excessive sugar intake, diabetes, and cognitive impairment We discussed the Mediterranean diet, which has been shown to help patients reduce the risk of progressive memory disorders and reduces cardiovascular risk. This includes eating fish, eat fruits and green leafy vegetables, nuts like almonds and hazelnuts, walnuts, and also use olive oil. Avoid fast foods and fried foods as much as possible. Avoid sweets and sugar as sugar use has been linked to worsening of memory function.

## 2019-10-28 ENCOUNTER — Telehealth: Payer: Self-pay | Admitting: Neurology

## 2019-10-28 NOTE — Telephone Encounter (Signed)
Bloodwork from PCP reviewed: 08/27/2019: TSH normal 0.97, B12 476

## 2019-11-08 ENCOUNTER — Ambulatory Visit
Admission: RE | Admit: 2019-11-08 | Discharge: 2019-11-08 | Disposition: A | Discharge status: Home / Self Care | Payer: BC Managed Care – PPO | Source: Ambulatory Visit | Attending: Neurology | Admitting: Neurology

## 2019-11-08 ENCOUNTER — Other Ambulatory Visit: Payer: Self-pay

## 2019-11-08 DIAGNOSIS — R413 Other amnesia: Secondary | ICD-10-CM

## 2019-11-08 DIAGNOSIS — R519 Headache, unspecified: Secondary | ICD-10-CM

## 2019-11-08 IMAGING — MR MR HEAD W/O CM
10 series · 48 of 48 positions shown · non-contrast
Comparison: None.

CLINICAL DATA: Encephalopathy

EXAM:
MRI HEAD WITHOUT CONTRAST
TECHNIQUE: Multiplanar, multiecho pulse sequences of the brain and surrounding
structures were obtained without intravenous contrast.

[Series 2: t1_se_sag · sagittal · 5.0mm · 0.45mm/px · 2 of 21 slices shown]
[im 1/21]
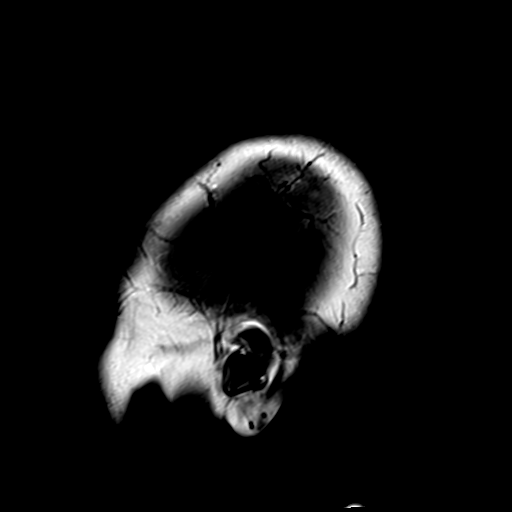
[im 21/21]
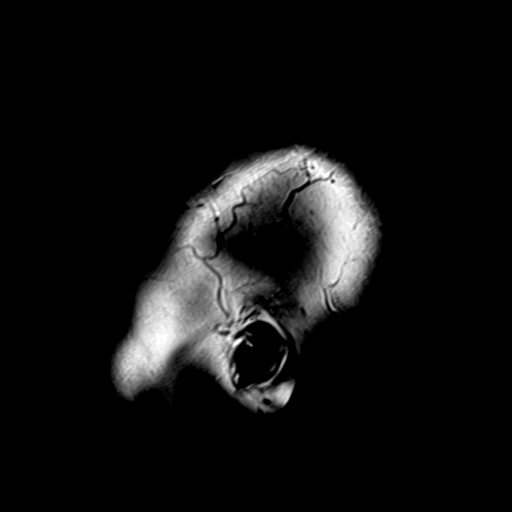

[Series 3: t2_tse_tra_512 · axial · 5.0mm · 0.60mm/px · z∈[-61,+77]mm · 2 of 24 slices shown]
[im 1/24]
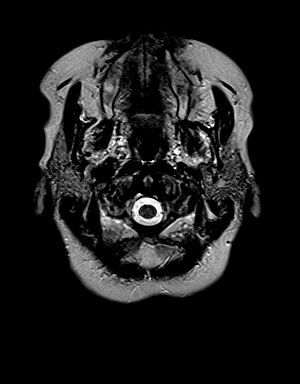
[im 24/24]
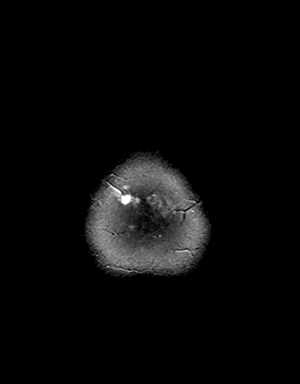

[Series 4: ep2d_diff_ · axial · 3.0mm · 1.80mm/px · z∈[-60,+74]mm · 9 of 92 slices shown]
[im 1/92]
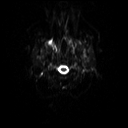
[im 12/92]
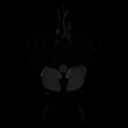
[im 23/92]
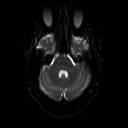
[im 35/92]
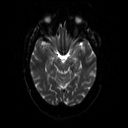
[im 46/92]
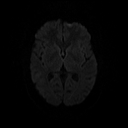
[im 57/92]
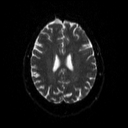
[im 69/92]
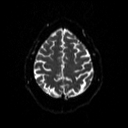
[im 80/92]
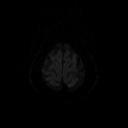
[im 92/92]
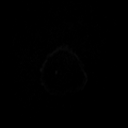

[Series 5: ep2d_diff__adc · axial · 3.0mm · 1.80mm/px · z∈[-60,+74]mm · 4 of 46 slices shown]
[im 1/46]
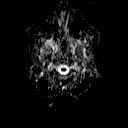
[im 16/46]
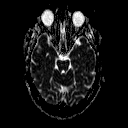
[im 31/46]
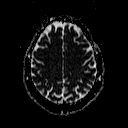
[im 46/46]
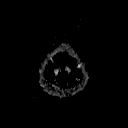

[Series 6: ep2d_diff_cor · coronal · 5.0mm · 1.77mm/px · 5 of 50 slices shown]
[im 1/50]
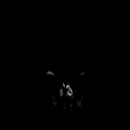
[im 13/50]
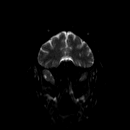
[im 25/50]
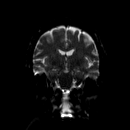
[im 37/50]
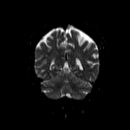
[im 50/50]
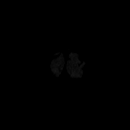

[Series 7: ep2d_diff_cor_adc · coronal · 5.0mm · 1.77mm/px · 2 of 25 slices shown]
[im 1/25]
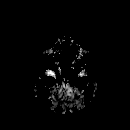
[im 25/25]
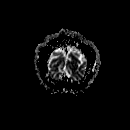

[Series 9: swi_images · axial · 4.0mm · 0.90mm/px · z∈[-62,+78]mm · 4 of 36 slices shown]
[im 1/36]
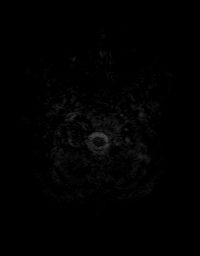
[im 12/36]
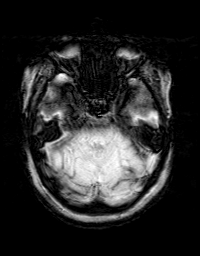
[im 24/36]
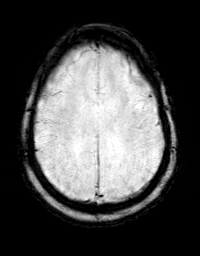
[im 36/36]
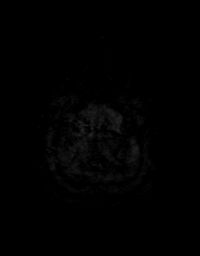

[Series 10: FLAIR · axial · 3.0mm · 0.43mm/px · z∈[-70,+85]mm · 3 of 27 slices shown]
[im 1/27]
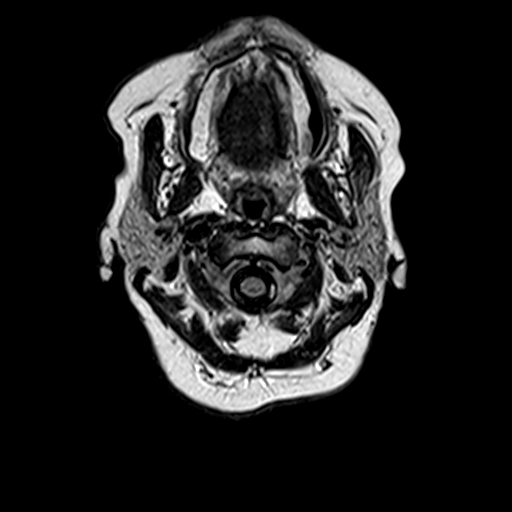
[im 14/27]
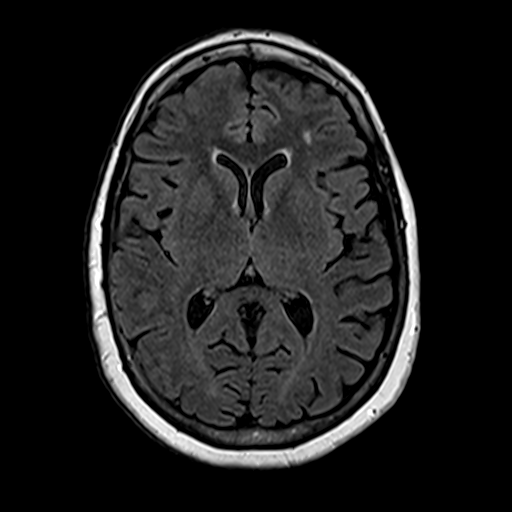
[im 27/27]
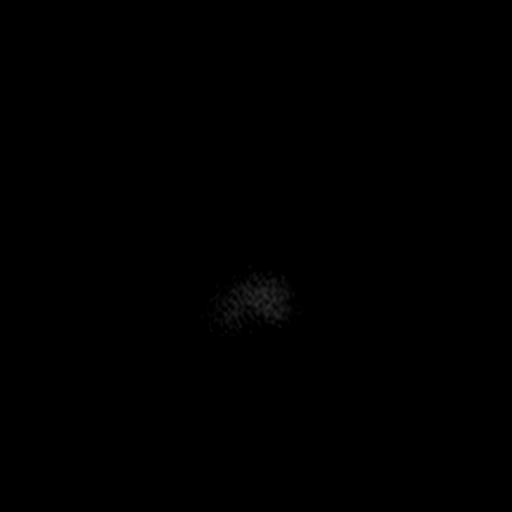

[Series 11: t1_mpr_tra · axial · 1.0mm · 0.72mm/px · z∈[-63,+79]mm · 14 of 144 slices shown]
[im 1/144]
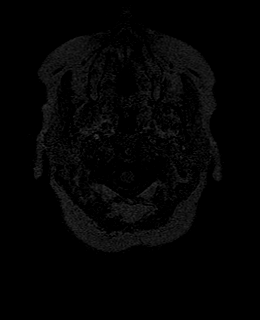
[im 12/144]
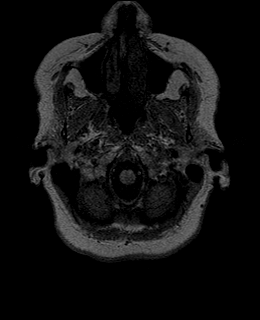
[im 23/144]
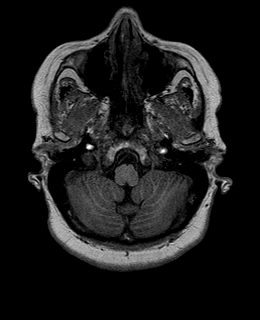
[im 34/144]
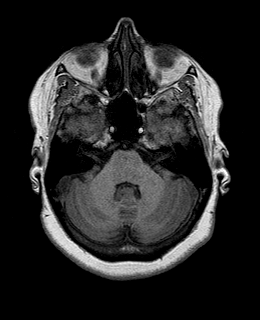
[im 45/144]
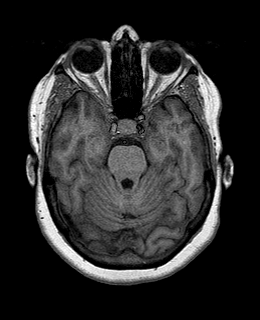
[im 56/144]
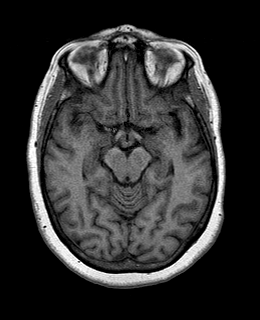
[im 67/144]
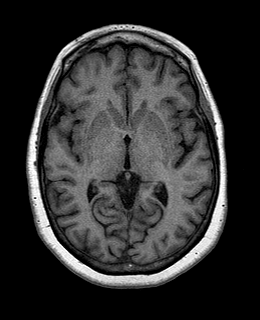
[im 78/144]
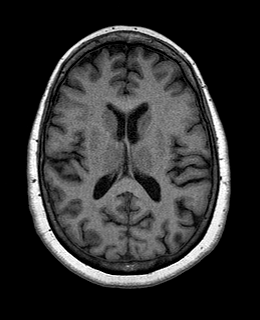
[im 89/144]
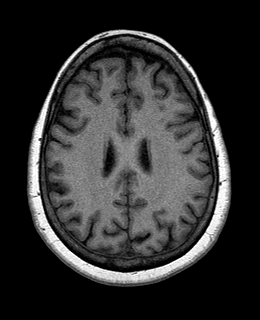
[im 100/144]
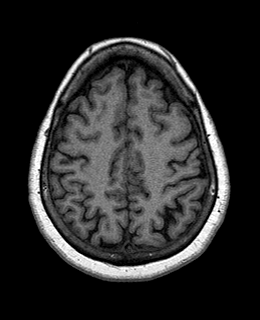
[im 111/144]
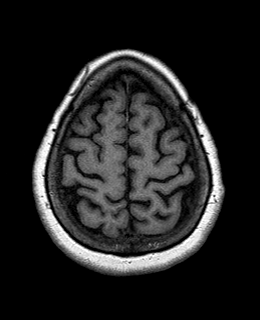
[im 122/144]
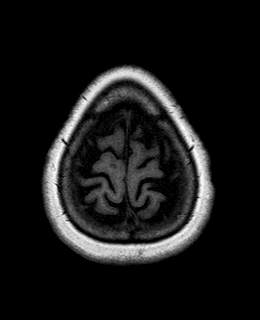
[im 133/144]
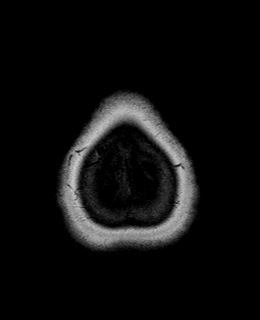
[im 144/144]
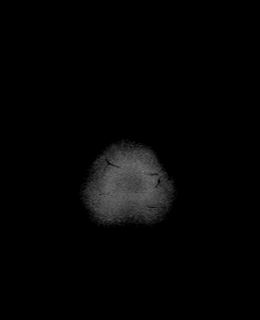

[Series 12: T2 · coronal · 5.0mm · 0.45mm/px · 3 of 26 slices shown]
[im 1/26]
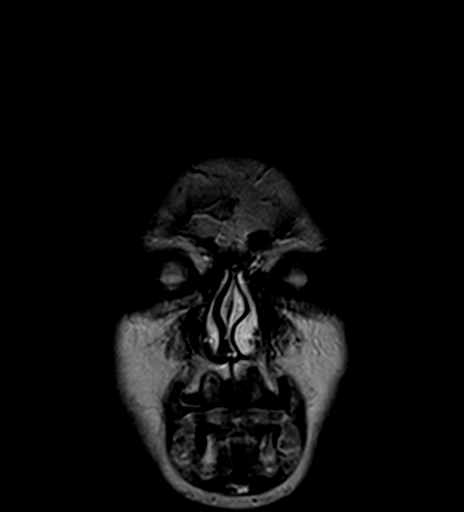
[im 13/26]
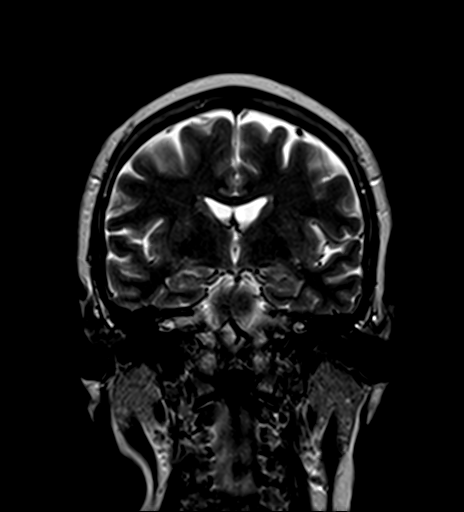
[im 26/26]
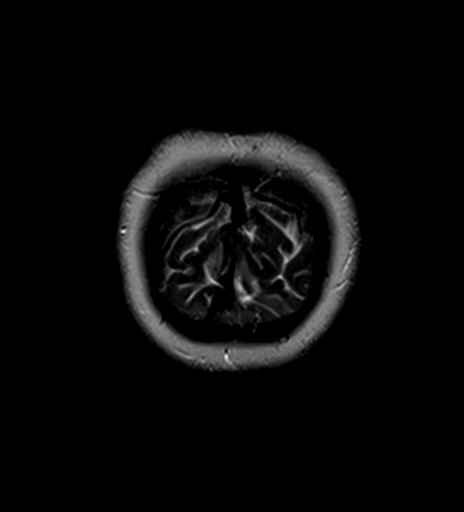

[48 of 48 positions shown; findings below may reference images not displayed]

FINDINGS: BRAIN: There is no acute infarct, acute hemorrhage or extra-axial
collection. Minimal white matter hyperintensity, nonspecific and
commonly seen in asymptomatic patients of this age. The cerebral and
cerebellar volume are age-appropriate. There is no hydrocephalus.
The midline structures are normal.

VASCULAR: The major intracranial arterial and venous sinus flow
voids are normal. Susceptibility-sensitive sequences show no chronic
microhemorrhage or superficial siderosis.

SKULL AND UPPER CERVICAL SPINE: Calvarial bone marrow signal is
normal. There is no skull base mass. The visualized upper cervical
spine and soft tissues are normal.

SINUSES/ORBITS: There are no fluid levels or advanced mucosal
thickening. The mastoid air cells and middle ear cavities are free
of fluid. The orbits are normal.
IMPRESSION: Normal aging brain.

## 2019-11-10 ENCOUNTER — Telehealth: Payer: Self-pay

## 2019-11-10 NOTE — Telephone Encounter (Signed)
-----   Message from Cameron Sprang, MD sent at 11/10/2019  9:36 AM EST ----- Pls let patient know I reviewed MRI brain, no evidence of tumor, stroke, or bleed. It shows age-related changes. Thanks

## 2019-11-10 NOTE — Telephone Encounter (Signed)
MRI results given to patient. No concerns at this time.

## 2020-01-27 ENCOUNTER — Encounter: Payer: BC Managed Care – PPO | Admitting: Psychology

## 2020-02-03 ENCOUNTER — Encounter: Payer: BC Managed Care – PPO | Admitting: Psychology

## 2020-05-10 ENCOUNTER — Ambulatory Visit: Payer: BC Managed Care – PPO | Admitting: Neurology

## 2020-07-05 ENCOUNTER — Other Ambulatory Visit: Payer: Self-pay | Admitting: Physician Assistant

## 2020-07-05 DIAGNOSIS — Z1231 Encounter for screening mammogram for malignant neoplasm of breast: Secondary | ICD-10-CM

## 2020-08-09 ENCOUNTER — Other Ambulatory Visit: Payer: Self-pay

## 2020-08-09 ENCOUNTER — Ambulatory Visit
Admission: RE | Admit: 2020-08-09 | Discharge: 2020-08-09 | Disposition: A | Discharge status: Home / Self Care | Payer: BC Managed Care – PPO | Source: Ambulatory Visit | Attending: Physician Assistant | Admitting: Physician Assistant

## 2020-08-09 DIAGNOSIS — Z1231 Encounter for screening mammogram for malignant neoplasm of breast: Secondary | ICD-10-CM

## 2020-11-08 ENCOUNTER — Ambulatory Visit: Payer: Self-pay

## 2020-11-08 NOTE — Telephone Encounter (Signed)
Pain to side and cough. Cough an occasional cough. Cough is non productive except first thing in am.Pain is not severe and when moves and turns over- right side. Does not hurt to take to deep breath in. Symptoms started 2-3 weeks. Side pain began the same day as cough. No fever, no SOB. Phlegm is yellow in the am and then cough is dry for the rest of the day.  Pain is located on side near flank side. No urinary symptoms. Pain is intermittent. Pain 5/10 soreness.  Pt given care advice and pt verbalized understanding. Pt has an appt in January for a new pt appt with Dr Jillyn Hidden. Called office and was advised to refer pt to [facility]. Spoke with pt and informed her to go to [facility] to get evaluated for right flank pain.    Reason for Disposition . Cough has been present for > 3 weeks  Answer Assessment - Initial Assessment Questions 1. ONSET: "When did the cough begin?"      3 weeks ago 2. SEVERITY: "How bad is the cough today?"      Occasional cough 3. SPUTUM: "Describe the color of your sputum" (none, dry cough; clear, white, yellow, green)     Yellow in the am 4. HEMOPTYSIS: "Are you coughing up any blood?" If so ask: "How much?" (flecks, streaks, tablespoons, etc.)     no 5. DIFFICULTY BREATHING: "Are you having difficulty breathing?" If Yes, ask: "How bad is it?" (e.g., mild, moderate, severe)    - MILD: No SOB at rest, mild SOB with walking, speaks normally in sentences, can lay down, no retractions, pulse < 100.    - MODERATE: SOB at rest, SOB with minimal exertion and prefers to sit, cannot lie down flat, speaks in phrases, mild retractions, audible wheezing, pulse 100-120.    - SEVERE: Very SOB at rest, speaks in single words, struggling to breathe, sitting hunched forward, retractions, pulse > 120      No SOB 6. FEVER: "Do you have a fever?" If Yes, ask: "What is your temperature, how was it measured, and when did it start?"     No fever 7. CARDIAC HISTORY: "Do you have any history of heart disease?"  (e.g., heart attack, congestive heart failure)      n/a 8. LUNG HISTORY: "Do you have any history of lung disease?"  (e.g., pulmonary embolus, asthma, emphysema)    no 9. PE RISK FACTORS: "Do you have a history of blood clots?" (or: recent major surgery, recent prolonged travel, bedridden)     no 10. OTHER SYMPTOMS: "Do you have any other symptoms?" (e.g., runny nose, wheezing, chest pain)       none 11. PREGNANCY: "Is there any chance you are pregnant?" "When was your last menstrual period?"       n/a 12. TRAVEL: "Have you traveled out of the country in the last month?" (e.g., travel history, exposures)    no  Protocols used: COUGH - ACUTE NON-PRODUCTIVE-A-AH

## 2020-11-21 ENCOUNTER — Encounter: Payer: Self-pay | Admitting: Emergency Medicine

## 2020-11-21 ENCOUNTER — Ambulatory Visit (INDEPENDENT_AMBULATORY_CARE_PROVIDER_SITE_OTHER): Payer: BC Managed Care – PPO

## 2020-11-21 ENCOUNTER — Other Ambulatory Visit: Payer: Self-pay

## 2020-11-21 ENCOUNTER — Ambulatory Visit
Admission: EM | Admit: 2020-11-21 | Discharge: 2020-11-21 | Disposition: A | Discharge status: Home / Self Care | Payer: BC Managed Care – PPO | Attending: Emergency Medicine | Admitting: Emergency Medicine

## 2020-11-21 DIAGNOSIS — R053 Chronic cough: Secondary | ICD-10-CM

## 2020-11-21 DIAGNOSIS — R1011 Right upper quadrant pain: Secondary | ICD-10-CM | POA: Diagnosis not present

## 2020-11-21 DIAGNOSIS — R059 Cough, unspecified: Secondary | ICD-10-CM

## 2020-11-21 LAB — COMPREHENSIVE METABOLIC PANEL
ALT: 9 IU/L (ref 0–32)
AST: 10 IU/L (ref 0–40)
Albumin/Globulin Ratio: 1.7 (ref 1.2–2.2)
Albumin: 4 g/dL (ref 3.8–4.8)
Alkaline Phosphatase: 101 IU/L (ref 44–121)
BUN/Creatinine Ratio: 22 (ref 12–28)
BUN: 13 mg/dL (ref 8–27)
Bilirubin Total: 0.3 mg/dL (ref 0.0–1.2)
CO2: 27 mmol/L (ref 20–29)
Calcium: 9.4 mg/dL (ref 8.7–10.3)
Chloride: 103 mmol/L (ref 96–106)
Creatinine, Ser: 0.6 mg/dL (ref 0.57–1.00)
GFR calc Af Amer: 111 mL/min/{1.73_m2} (ref >59)
GFR calc non Af Amer: 97 mL/min/{1.73_m2} (ref >59)
Globulin, Total: 2.4 g/dL (ref 1.5–4.5)
Glucose: 95 mg/dL (ref 65–99)
Potassium: 4.1 mmol/L (ref 3.5–5.2)
Sodium: 141 mmol/L (ref 134–144)
Total Protein: 6.4 g/dL (ref 6.0–8.5)

## 2020-11-21 MED ORDER — BENZONATATE 100 MG PO CAPS: 100.0000 mg | ORAL_CAPSULE | Freq: Three times a day (TID) | ORAL | 0 refills | Status: DC

## 2020-11-21 NOTE — ED Provider Notes (Signed)
[name]    CSN: [id number] Arrival date & time: 11/21/20  0093      History   Chief Complaint Chief Complaint  Patient presents with  . Cough    HPI Samantha Benton is a 64 y.o. female  With history as below presenting for chronic cough, RUQ pain. States she spoke with her PCP about this: advised by PCP's office to present to UC for evaluation of chronic cough.  Patient states that cough is dry and without wheezing, chest pain, palpitations, shortness of breath. Denies change in tach, lifestyle, medications. No alcohol or tobacco use. Patient also notes right thoracic back and RUQ pain. Sometimes worse with movement, though otherwise difficult to discern what makes it worse. Nothing has improved it. Patient is concerned that it could be her liver as her eyes appear to be yellowish upon waking for the last 2 days. States about after an hour they returned to normal. Patient denies abdominal distention, nausea, vomiting, change in urinary or bowel habit, lower leg swelling. Does have history of cholecystectomy.  Past Medical History:  Diagnosis Date  . Anemia    hx. of  . Anxiety    pt. denies at preop appt.  . Arthritis    right knee  . Family history of adverse reaction to anesthesia    daughter woke up during surgery  . GERD (gastroesophageal reflux disease)   . PONV (postoperative nausea and vomiting)     Patient Active Problem List   Diagnosis Date Noted  . OA (osteoarthritis) of knee 05/11/2015  . 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 Arthrofibrosis of knee joint 12/09/2014  . Family history of early CAD 11/02/2014  . Esophageal reflux 03/04/2014  . Shortness of breath 03/04/2014  . Dizziness 02/08/2012  . Chest pain 01/25/2012  . Abdominal pain 01/25/2012  . Physical exam, annual 01/25/2012    Past Surgical History:  Procedure Laterality Date  . CARPAL TUNNEL RELEASE  2008  . CHOLECYSTECTOMY  1970  . gallstone  removal    . KNEE ARTHROSCOPY  2002   right knee  . KNEE ARTHROTOMY Left 12/09/2014   Procedure: LEFT KNEE ARTHROTOMY WITH SCAR EXCISION;  Surgeon: Loanne Drilling, MD;  Location: [facility];  Service: Orthopedics;  Laterality: Left;  . KNEE CLOSED REDUCTION Left 05/12/2015   Procedure: LEFT KNEE CLOSED MANIPULATION ;  Surgeon: Ollen Gross, MD;  Location: [facility];  Service: Orthopedics;  Laterality: Left;  . LEFT HEART CATHETERIZATION WITH CORONARY ANGIOGRAM N/A 11/13/2014   Procedure: LEFT HEART CATHETERIZATION WITH CORONARY ANGIOGRAM;  Surgeon: Corky Crafts, MD;  Location: [facility] CATH LAB;  Service: Cardiovascular;  Laterality: N/A;  . TOTAL KNEE ARTHROPLASTY Right 06/04/2017   Procedure: RIGHT TOTAL KNEE ARTHROPLASTY;  Surgeon: Ollen Gross, MD;  Location: [facility];  Service: Orthopedics;  Laterality: Right;  Adductor Block  . TUBAL LIGATION      OB History   No obstetric history on file.      Home Medications    Prior to Admission medications   Medication Sig Start Date End Date Taking? Authorizing Provider  benzonatate (TESSALON) 100 MG capsule Take 1 capsule (100 mg total) by mouth every 8 (eight) hours. 11/21/20   Hall-Potvin, [place], PA-C  meloxicam (MOBIC) 15 MG tablet Take 15 mg by mouth daily.    [provider]  omeprazole (PRILOSEC) 40 MG capsule Take 40 mg by mouth daily as needed. For acid reflux. 04/16/17   [provider]  Family History Family History  Problem Relation Age of Onset  . Heart disease Mother 80       MI  . Cancer Mother 7       breast ca  . Breast cancer Mother   . Deep vein thrombosis Father   . Heart disease Sister 23       MI    Social History Social History   Tobacco Use  . Smoking status: Never Smoker  . Smokeless tobacco: Never Used  Vaping Use  . Vaping Use: Never used  Substance Use Topics  . Alcohol use: No  . Drug use: No     Allergies   Penicillins and Nabumetone   Review of Systems Review of  Systems  Constitutional: Negative for fatigue and fever.  HENT: Negative for congestion, dental problem, ear pain, facial swelling, hearing loss, sinus pain, sore throat, trouble swallowing and voice change.   Eyes: Negative for photophobia, pain, redness and visual disturbance.  Respiratory: Positive for cough. Negative for shortness of breath.   Cardiovascular: Negative for chest pain and palpitations.  Gastrointestinal: Positive for abdominal pain. Negative for abdominal distention, blood in stool, constipation, diarrhea, nausea, rectal pain and vomiting.  Musculoskeletal: Positive for back pain. Negative for arthralgias and myalgias.  Skin: Negative for rash and wound.  Neurological: Negative for dizziness, syncope and headaches.     Physical Exam Triage Vital Signs ED Triage Vitals  Enc Vitals Group     BP      Pulse      Resp      Temp      Temp src      SpO2      Weight      Height      Head Circumference      Peak Flow      Pain Score      Pain Loc      Pain Edu?      Excl. in GC?    No data found.  Updated Vital Signs BP (!) 160/85 (BP Location: Left Arm)   Pulse 95   Temp 98.3 F (36.8 C) (Oral)   Resp 18   SpO2 97%   Visual Acuity Right Eye Distance:   Left Eye Distance:   Bilateral Distance:    Right Eye Near:   Left Eye Near:    Bilateral Near:     Physical Exam Constitutional:      General: She is not in acute distress.    Appearance: She is not ill-appearing or diaphoretic.  HENT:     Head: Normocephalic and atraumatic.     Right Ear: Tympanic membrane and ear canal normal.     Left Ear: Tympanic membrane and ear canal normal.     Mouth/Throat:     Mouth: Mucous membranes are moist.     Pharynx: Oropharynx is clear. No oropharyngeal exudate or posterior oropharyngeal erythema.  Eyes:     General: No scleral icterus.    Conjunctiva/sclera: Conjunctivae normal.     Pupils: Pupils are equal, round, and reactive to light.  Neck:      Comments: Trachea midline, negative JVD Cardiovascular:     Rate and Rhythm: Normal rate and regular rhythm.     Heart sounds: No murmur heard.  No gallop.   Pulmonary:     Effort: Pulmonary effort is normal. No respiratory distress.     Breath sounds: No wheezing, rhonchi or rales.  Abdominal:     General: Bowel  sounds are normal. There is no distension.     Palpations: Abdomen is soft.     Tenderness: There is abdominal tenderness. There is no right CVA tenderness, left CVA tenderness, guarding or rebound.     Comments: Negative Murphy's, McBurney's, Rovsing. Patient has transverse right surgical scar from cholecystectomy. Mild RUQ tenderness. No appreciable hepatosplenomegaly or friction rub.  Musculoskeletal:     Cervical back: Neck supple. No tenderness.  Lymphadenopathy:     Cervical: No cervical adenopathy.  Skin:    Capillary Refill: Capillary refill takes less than 2 seconds.     Coloration: Skin is not jaundiced or pale.     Findings: No rash.  Neurological:     General: No focal deficit present.     Mental Status: She is alert and oriented to person, place, and time.      UC Treatments / Results  Labs (all labs ordered are listed, but only abnormal results are displayed) Labs Reviewed  COMPREHENSIVE METABOLIC PANEL    EKG   Radiology DG Chest 2 View  Result Date: 11/21/2020 CLINICAL DATA:  Cough for 1 month EXAM: CHEST - 2 VIEW COMPARISON:  May 17, 2018 FINDINGS: Lungs clear. Heart size and pulmonary vascularity are normal. No adenopathy. There is degenerative change in thoracic spine. There is thoracolumbar dextroscoliosis. IMPRESSION: Lungs clear.  Cardiac silhouette normal. Electronically Signed   By: Bretta Bang III M.D.   On: 11/21/2020 09:11    Procedures Procedures (including critical care time)  Medications Ordered in UC Medications - No data to display  Initial Impression / Assessment and Plan / UC Course  I have reviewed the triage vital  signs and the nursing notes.  Pertinent labs & imaging results that were available during my care of the patient were reviewed by me and considered in my medical decision making (see chart for details).     Patient afebrile, nontoxic in office today. No signs of jaundice or scleral icterus. Abdominal and cardiopulmonary exam benign at this time. Chest x-ray negative. Will trial benzonatate for chronic cough, follow-up with PCP for further evaluation management thereof. No change in home medications. Will obtain comp for liver enzyme screening. If mildly elevated, will follow up with PCP for further evaluation, if significantly elevated, or patient develops yellowing of eyes, severe abdominal pain with vomiting, or fever, will present to ER for further evaluation/management.  Return precautions discussed, pt verbalized understanding and is agreeable to plan. Final Clinical Impressions(s) / UC Diagnoses   Final diagnoses:  Chronic cough  RUQ pain   Discharge Instructions   None    ED Prescriptions    Medication Sig Dispense Auth. Provider   benzonatate (TESSALON) 100 MG capsule Take 1 capsule (100 mg total) by mouth every 8 (eight) hours. 21 capsule Hall-Potvin, [place], PA-C     I have reviewed the PDMP during this encounter.   Hall-Potvin, [place], [place] 11/21/20 1016

## 2020-11-21 NOTE — ED Triage Notes (Signed)
Pt here for cough x 30 days; pt sts pain on right side with cough; pt also sts her "eyes have been yellow x 2 days upon waking"; pt sts after an hour they return to normal

## 2021-01-17 ENCOUNTER — Ambulatory Visit: Payer: BC Managed Care – PPO | Admitting: Family Medicine

## 2021-03-02 ENCOUNTER — Ambulatory Visit: Payer: BC Managed Care – PPO | Admitting: Family Medicine

## 2021-07-12 ENCOUNTER — Other Ambulatory Visit: Payer: Self-pay | Admitting: Family Medicine

## 2021-07-12 ENCOUNTER — Other Ambulatory Visit: Payer: Self-pay | Admitting: Physician Assistant

## 2021-07-12 DIAGNOSIS — Z1231 Encounter for screening mammogram for malignant neoplasm of breast: Secondary | ICD-10-CM

## 2021-09-07 ENCOUNTER — Other Ambulatory Visit: Payer: Self-pay

## 2021-09-07 ENCOUNTER — Ambulatory Visit
Admission: RE | Admit: 2021-09-07 | Discharge: 2021-09-07 | Disposition: A | Discharge status: Home / Self Care | Payer: BC Managed Care – PPO | Source: Ambulatory Visit | Attending: Family Medicine | Admitting: Family Medicine

## 2021-09-07 DIAGNOSIS — Z1231 Encounter for screening mammogram for malignant neoplasm of breast: Secondary | ICD-10-CM

## 2022-01-27 ENCOUNTER — Other Ambulatory Visit (HOSPITAL_COMMUNITY): Payer: Self-pay | Admitting: Student

## 2022-01-27 ENCOUNTER — Other Ambulatory Visit: Payer: Self-pay | Admitting: Student

## 2022-01-27 DIAGNOSIS — Z96651 Presence of right artificial knee joint: Secondary | ICD-10-CM | POA: Diagnosis not present

## 2022-01-27 DIAGNOSIS — M25561 Pain in right knee: Secondary | ICD-10-CM | POA: Diagnosis not present

## 2022-02-03 ENCOUNTER — Other Ambulatory Visit: Payer: Self-pay

## 2022-02-03 ENCOUNTER — Encounter (HOSPITAL_COMMUNITY)
Admission: RE | Admit: 2022-02-03 | Discharge: 2022-02-03 | Disposition: A | Discharge status: Home / Self Care | Payer: Medicare PPO | Source: Ambulatory Visit | Attending: Student | Admitting: Student

## 2022-02-03 DIAGNOSIS — Z96651 Presence of right artificial knee joint: Secondary | ICD-10-CM | POA: Diagnosis not present

## 2022-02-03 DIAGNOSIS — M25561 Pain in right knee: Secondary | ICD-10-CM | POA: Diagnosis not present

## 2022-02-03 DIAGNOSIS — M7989 Other specified soft tissue disorders: Secondary | ICD-10-CM | POA: Diagnosis not present

## 2022-02-03 DIAGNOSIS — Z96653 Presence of artificial knee joint, bilateral: Secondary | ICD-10-CM | POA: Diagnosis not present

## 2022-02-03 MED ORDER — TECHNETIUM TC 99M MEDRONATE IV KIT
20.0000 | PACK | Freq: Once | INTRAVENOUS | Status: AC | PRN
  Administered 2022-02-03: 20 via INTRAVENOUS

## 2022-02-24 DIAGNOSIS — M179 Osteoarthritis of knee, unspecified: Secondary | ICD-10-CM | POA: Diagnosis not present

## 2022-02-28 DIAGNOSIS — Z880 Allergy status to penicillin: Secondary | ICD-10-CM | POA: Diagnosis not present

## 2022-02-28 DIAGNOSIS — Z791 Long term (current) use of non-steroidal anti-inflammatories (NSAID): Secondary | ICD-10-CM | POA: Diagnosis not present

## 2022-02-28 DIAGNOSIS — Z8249 Family history of ischemic heart disease and other diseases of the circulatory system: Secondary | ICD-10-CM | POA: Diagnosis not present

## 2022-02-28 DIAGNOSIS — K219 Gastro-esophageal reflux disease without esophagitis: Secondary | ICD-10-CM | POA: Diagnosis not present

## 2022-02-28 DIAGNOSIS — M1990 Unspecified osteoarthritis, unspecified site: Secondary | ICD-10-CM | POA: Diagnosis not present

## 2022-02-28 DIAGNOSIS — Z7722 Contact with and (suspected) exposure to environmental tobacco smoke (acute) (chronic): Secondary | ICD-10-CM | POA: Diagnosis not present

## 2022-02-28 DIAGNOSIS — G8929 Other chronic pain: Secondary | ICD-10-CM | POA: Diagnosis not present

## 2022-02-28 DIAGNOSIS — R030 Elevated blood-pressure reading, without diagnosis of hypertension: Secondary | ICD-10-CM | POA: Diagnosis not present

## 2022-02-28 DIAGNOSIS — K5900 Constipation, unspecified: Secondary | ICD-10-CM | POA: Diagnosis not present

## 2022-04-27 ENCOUNTER — Ambulatory Visit: Payer: Medicare PPO | Admitting: Orthopaedic Surgery

## 2022-04-27 ENCOUNTER — Encounter: Payer: Self-pay | Admitting: Orthopaedic Surgery

## 2022-04-27 ENCOUNTER — Ambulatory Visit (INDEPENDENT_AMBULATORY_CARE_PROVIDER_SITE_OTHER): Payer: Medicare PPO

## 2022-04-27 VITALS — Ht 60.0 in | Wt 145.0 lb

## 2022-04-27 DIAGNOSIS — M25561 Pain in right knee: Secondary | ICD-10-CM

## 2022-04-27 DIAGNOSIS — G8929 Other chronic pain: Secondary | ICD-10-CM | POA: Diagnosis not present

## 2022-04-27 NOTE — Progress Notes (Signed)
? ?Office Visit Note ?  ?Patient: Samantha Benton           ?Date of Birth: 1956/07/12           ?MRN: [id number] ?Visit Date: 04/27/2022 ?             ?Requested by: Clovis Riley, L.August Saucer, MD ?[address] ?[address] ?[place],  [place] [address] ?PCP: Clovis Riley, L.August Saucer, MD ? ? ?Assessment & Plan: ?Visit Diagnoses:  ?1. Chronic pain of right knee   ? ? ?Plan: Impression is right knee pain status post right total knee replacement.  I feel that her symptoms stem from adhesions within the knee and this causes a functional problem especially with driving.  The patient's bone scan is relatively unremarkable.  We will go ahead and get lab work to rule out infection.  We will be in touch with her  through mychart with the results.  We have also discussed arthroscopic synovectomy as long as her lab work is unremarkable.  We will discuss this further as needed.  Call with concerns or questions. ? ?Follow-Up Instructions: Return if symptoms worsen or fail to improve.  ? ?Orders:  ?Orders Placed This Encounter  ?Procedures  ? XR KNEE 3 VIEW RIGHT  ? Sedimentation rate  ? CBC with Differential/Platelet  ? C-reactive protein  ? ?No orders of the defined types were placed in this encounter. ? ? ? ? Procedures: ?No procedures performed ? ? ?Clinical Data: ?No additional findings. ? ? ?Subjective: ?Chief Complaint  ?Patient presents with  ? Right Knee - Pain  ? ? ?HPI patient is a pleasant 66 year old female who comes in today with right knee pain.  She is status post right total knee replacement by Dr. Antony Odea 06/04/2017.  She notes she was doing well for the first 2 years, but never regained full range of motion.  Over the past 3 years, she notes her symptoms have worsened.  She has fallen a few times due to the pain.  The pain occurs to the medial knee and is worse after her knee has been in a flexed position for more than 15 minutes such as when she is driving.  She then starts to get a heavy feeling and has to stand up and extend her  knee.  She has been taking meloxicam without relief.  Recent bone scan by Dr. Antony Odea showed mild uptake in both knees to the tibial plateau.  She has not previously been worked up for infection.  She denies any fevers or chills or any other systemic symptoms. ? ?Review of Systems as detailed in HPI.  All others reviewed and are negative. ? ? ?Objective: ?Vital Signs: Ht 5' (1.524 m)   Wt 145 lb (65.8 kg)   BMI 28.32 kg/m?  ? ?Physical Exam well-developed well-nourished female in no acute distress but alert and oriented x3. ? ?Ortho Exam right knee shows no effusion.  Range of motion 0 to 85 degrees with firm endpoint.  She is stable to valgus varus stress.  She is neurovascular intact distally. ? ?Specialty Comments:  ?No specialty comments available. ? ?Imaging: ?No results found. ? ? ?PMFS History: ?Patient Active Problem List  ? Diagnosis Date Noted  ? OA (osteoarthritis) of knee 05/11/2015  ? 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 Arthrofibrosis of knee joint 12/09/2014  ? Family history of early CAD 11/02/2014  ? Esophageal reflux 03/04/2014  ? Shortness of breath 03/04/2014  ? Dizziness 02/08/2012  ?  Chest pain 01/25/2012  ? Abdominal pain 01/25/2012  ? Physical exam, annual 01/25/2012  ? ?Past Medical History:  ?Diagnosis Date  ? Anemia   ? hx. of  ? Anxiety   ? pt. denies at preop appt.  ? Arthritis   ? right knee  ? Family history of adverse reaction to anesthesia   ? daughter woke up during surgery  ? GERD (gastroesophageal reflux disease)   ? PONV (postoperative nausea and vomiting)   ?  ?Family History  ?Problem Relation Age of Onset  ? Heart disease Mother 53  ?     MI  ? Cancer Mother 70  ?     breast ca  ? Breast cancer Mother   ? Deep vein thrombosis Father   ? Heart disease Sister 74  ?     MI  ?  ?Past Surgical History:  ?Procedure Laterality Date  ? CARPAL TUNNEL RELEASE  2008  ? CHOLECYSTECTOMY  1970  ? gallstone removal    ? KNEE ARTHROSCOPY  2002  ? right knee  ?  KNEE ARTHROTOMY Left 12/09/2014  ? Procedure: LEFT KNEE ARTHROTOMY WITH SCAR EXCISION;  Surgeon: Loanne Drilling, MD;  Location: [facility];  Service: Orthopedics;  Laterality: Left;  ? KNEE CLOSED REDUCTION Left 05/12/2015  ? Procedure: LEFT KNEE CLOSED MANIPULATION ;  Surgeon: Ollen Gross, MD;  Location: [facility];  Service: Orthopedics;  Laterality: Left;  ? LEFT HEART CATHETERIZATION WITH CORONARY ANGIOGRAM N/A 11/13/2014  ? Procedure: LEFT HEART CATHETERIZATION WITH CORONARY ANGIOGRAM;  Surgeon: Corky Crafts, MD;  Location: [facility] CATH LAB;  Service: Cardiovascular;  Laterality: N/A;  ? TOTAL KNEE ARTHROPLASTY Right 06/04/2017  ? Procedure: RIGHT TOTAL KNEE ARTHROPLASTY;  Surgeon: Ollen Gross, MD;  Location: [facility];  Service: Orthopedics;  Laterality: Right;  Adductor Block  ? TUBAL LIGATION    ? ?Social History  ? ?Occupational History  ? Occupation: Housekeeping  ?  Employer: [employer]  ?Tobacco Use  ? Smoking status: Never  ? Smokeless tobacco: Never  ?Vaping Use  ? Vaping Use: Never used  ?Substance and Sexual Activity  ? Alcohol use: No  ? Drug use: No  ? Sexual activity: Not Currently  ?  Partners: Male  ? ? ? ? ? ? ?

## 2022-04-28 LAB — CBC WITH DIFFERENTIAL/PLATELET
Absolute Monocytes: 365 cells/uL (ref 200–950)
Basophils Absolute: 38 cells/uL (ref 0–200)
Basophils Relative: 0.6 %
Eosinophils Absolute: 198 cells/uL (ref 15–500)
Eosinophils Relative: 3.1 %
HCT: 38.1 % (ref 35.0–45.0)
Hemoglobin: 12.2 g/dL (ref 11.7–15.5)
Lymphs Abs: 1683 cells/uL (ref 850–3900)
MCH: 28.8 pg (ref 27.0–33.0)
MCHC: 32 g/dL (ref 32.0–36.0)
MCV: 90.1 fL (ref 80.0–100.0)
MPV: 11.4 fL (ref 7.5–12.5)
Monocytes Relative: 5.7 %
Neutro Abs: 4115 cells/uL (ref 1500–7800)
Neutrophils Relative %: 64.3 %
Platelets: 259 10*3/uL (ref 140–400)
RBC: 4.23 10*6/uL (ref 3.80–5.10)
RDW: 14.5 % (ref 11.0–15.0)
Total Lymphocyte: 26.3 %
WBC: 6.4 10*3/uL (ref 3.8–10.8)

## 2022-04-28 LAB — SEDIMENTATION RATE: Sed Rate: 25 mm/h (ref 0–30)

## 2022-04-28 LAB — C-REACTIVE PROTEIN: CRP: 8.2 mg/L — ABNORMAL HIGH (ref <8.0)

## 2022-05-01 ENCOUNTER — Telehealth: Payer: Self-pay | Admitting: Orthopaedic Surgery

## 2022-05-01 NOTE — Telephone Encounter (Signed)
Pt called stating she has medical question about Dr. Erlinda Hong next steps for pt. Please call pt at [phone]. ?

## 2022-05-01 NOTE — Telephone Encounter (Signed)
Please advise 

## 2022-05-02 MED ORDER — DICLOFENAC SODIUM 75 MG PO TBEC: 75.0000 mg | DELAYED_RELEASE_TABLET | Freq: Two times a day (BID) | ORAL | 2 refills | Status: DC

## 2022-05-02 NOTE — Telephone Encounter (Signed)
Spoke to patient about lab work and again discussed treatment options.  I think her only option at this time is to do an open synovectomy and poly exchange which I feel gives her a 50/50 chance of improving the pain and ROM.  She would like to try a different NSAID for now.  I sent diclofenac.  She'll follow up as needed.  ?

## 2022-06-13 DIAGNOSIS — D692 Other nonthrombocytopenic purpura: Secondary | ICD-10-CM | POA: Diagnosis not present

## 2022-08-16 DIAGNOSIS — R202 Paresthesia of skin: Secondary | ICD-10-CM | POA: Diagnosis not present

## 2022-08-16 DIAGNOSIS — R059 Cough, unspecified: Secondary | ICD-10-CM | POA: Diagnosis not present

## 2022-08-31 ENCOUNTER — Telehealth: Payer: Self-pay | Admitting: Orthopaedic Surgery

## 2022-08-31 NOTE — Telephone Encounter (Signed)
Pt called requesting a call back from Dr Roda Shutters or PA Dub Mikes. Pt states she has questions about last office visit. Please call pt at [phone]

## 2022-08-31 NOTE — Telephone Encounter (Signed)
Pt returned call to Dr Roda Shutters but pt was at a funeral. Pt phone number is [phone]

## 2022-08-31 NOTE — Telephone Encounter (Signed)
Left voice mail

## 2022-09-01 NOTE — Telephone Encounter (Signed)
Spoke to patient and answered her questions

## 2022-10-11 DIAGNOSIS — R208 Other disturbances of skin sensation: Secondary | ICD-10-CM | POA: Diagnosis not present

## 2022-10-24 DIAGNOSIS — M25561 Pain in right knee: Secondary | ICD-10-CM | POA: Diagnosis not present

## 2022-11-01 DIAGNOSIS — M25561 Pain in right knee: Secondary | ICD-10-CM | POA: Diagnosis not present

## 2022-11-22 DIAGNOSIS — M25561 Pain in right knee: Secondary | ICD-10-CM | POA: Diagnosis not present

## 2022-12-07 DIAGNOSIS — Z131 Encounter for screening for diabetes mellitus: Secondary | ICD-10-CM | POA: Diagnosis not present

## 2022-12-07 DIAGNOSIS — R7301 Impaired fasting glucose: Secondary | ICD-10-CM | POA: Diagnosis not present

## 2022-12-07 DIAGNOSIS — Z0000 Encounter for general adult medical examination without abnormal findings: Secondary | ICD-10-CM | POA: Diagnosis not present

## 2022-12-07 DIAGNOSIS — M159 Polyosteoarthritis, unspecified: Secondary | ICD-10-CM | POA: Diagnosis not present

## 2022-12-07 DIAGNOSIS — R413 Other amnesia: Secondary | ICD-10-CM | POA: Diagnosis not present

## 2022-12-07 DIAGNOSIS — K219 Gastro-esophageal reflux disease without esophagitis: Secondary | ICD-10-CM | POA: Diagnosis not present

## 2022-12-07 DIAGNOSIS — E559 Vitamin D deficiency, unspecified: Secondary | ICD-10-CM | POA: Diagnosis not present

## 2022-12-07 DIAGNOSIS — Z96659 Presence of unspecified artificial knee joint: Secondary | ICD-10-CM | POA: Diagnosis not present

## 2022-12-07 DIAGNOSIS — Z136 Encounter for screening for cardiovascular disorders: Secondary | ICD-10-CM | POA: Diagnosis not present

## 2022-12-07 DIAGNOSIS — M8588 Other specified disorders of bone density and structure, other site: Secondary | ICD-10-CM | POA: Diagnosis not present

## 2023-07-27 DIAGNOSIS — M159 Polyosteoarthritis, unspecified: Secondary | ICD-10-CM | POA: Diagnosis not present

## 2023-07-27 DIAGNOSIS — M549 Dorsalgia, unspecified: Secondary | ICD-10-CM | POA: Diagnosis not present

## 2023-07-27 DIAGNOSIS — M2170 Unequal limb length (acquired), unspecified site: Secondary | ICD-10-CM | POA: Diagnosis not present

## 2023-07-31 DIAGNOSIS — M545 Low back pain, unspecified: Secondary | ICD-10-CM | POA: Diagnosis not present

## 2023-08-15 DIAGNOSIS — M5441 Lumbago with sciatica, right side: Secondary | ICD-10-CM | POA: Diagnosis not present

## 2023-08-15 DIAGNOSIS — M21762 Unequal limb length (acquired), left tibia: Secondary | ICD-10-CM | POA: Diagnosis not present

## 2023-08-15 DIAGNOSIS — G8929 Other chronic pain: Secondary | ICD-10-CM | POA: Diagnosis not present

## 2023-08-17 ENCOUNTER — Other Ambulatory Visit: Payer: Self-pay | Admitting: Internal Medicine

## 2023-08-17 DIAGNOSIS — M5441 Lumbago with sciatica, right side: Secondary | ICD-10-CM

## 2023-08-25 ENCOUNTER — Other Ambulatory Visit: Payer: Medicare PPO

## 2023-08-25 DIAGNOSIS — M5441 Lumbago with sciatica, right side: Secondary | ICD-10-CM

## 2023-08-26 ENCOUNTER — Ambulatory Visit
Admission: RE | Admit: 2023-08-26 | Discharge: 2023-08-26 | Disposition: A | Discharge status: Home / Self Care | Payer: Medicare PPO | Source: Ambulatory Visit | Attending: Internal Medicine | Admitting: Internal Medicine

## 2023-08-26 DIAGNOSIS — M5126 Other intervertebral disc displacement, lumbar region: Secondary | ICD-10-CM | POA: Diagnosis not present

## 2023-08-26 DIAGNOSIS — M4316 Spondylolisthesis, lumbar region: Secondary | ICD-10-CM | POA: Diagnosis not present

## 2023-08-26 DIAGNOSIS — M48061 Spinal stenosis, lumbar region without neurogenic claudication: Secondary | ICD-10-CM | POA: Diagnosis not present

## 2023-09-13 DIAGNOSIS — M48061 Spinal stenosis, lumbar region without neurogenic claudication: Secondary | ICD-10-CM | POA: Diagnosis not present

## 2023-09-13 DIAGNOSIS — M4316 Spondylolisthesis, lumbar region: Secondary | ICD-10-CM | POA: Diagnosis not present

## 2023-09-13 DIAGNOSIS — M5416 Radiculopathy, lumbar region: Secondary | ICD-10-CM | POA: Diagnosis not present

## 2023-09-13 DIAGNOSIS — Z6826 Body mass index (BMI) 26.0-26.9, adult: Secondary | ICD-10-CM | POA: Diagnosis not present

## 2023-09-24 DIAGNOSIS — M5416 Radiculopathy, lumbar region: Secondary | ICD-10-CM | POA: Diagnosis not present

## 2023-10-31 DIAGNOSIS — M48061 Spinal stenosis, lumbar region without neurogenic claudication: Secondary | ICD-10-CM | POA: Diagnosis not present

## 2023-10-31 DIAGNOSIS — Z6826 Body mass index (BMI) 26.0-26.9, adult: Secondary | ICD-10-CM | POA: Diagnosis not present

## 2023-10-31 DIAGNOSIS — M4316 Spondylolisthesis, lumbar region: Secondary | ICD-10-CM | POA: Diagnosis not present

## 2023-11-01 ENCOUNTER — Encounter (HOSPITAL_COMMUNITY): Payer: Self-pay

## 2023-11-01 ENCOUNTER — Emergency Department (HOSPITAL_COMMUNITY)
Admission: EM | Admit: 2023-11-01 | Discharge: 2023-11-01 | Disposition: A | Discharge status: Home / Self Care | Payer: Medicare PPO

## 2023-11-01 ENCOUNTER — Emergency Department (HOSPITAL_COMMUNITY): Payer: Medicare PPO

## 2023-11-01 DIAGNOSIS — R110 Nausea: Secondary | ICD-10-CM | POA: Diagnosis not present

## 2023-11-01 DIAGNOSIS — R42 Dizziness and giddiness: Secondary | ICD-10-CM | POA: Insufficient documentation

## 2023-11-01 DIAGNOSIS — R0689 Other abnormalities of breathing: Secondary | ICD-10-CM | POA: Diagnosis not present

## 2023-11-01 LAB — URINALYSIS, ROUTINE W REFLEX MICROSCOPIC
Bacteria, UA: NONE SEEN
Bilirubin Urine: NEGATIVE
Glucose, UA: NEGATIVE mg/dL
Hgb urine dipstick: NEGATIVE
Ketones, ur: NEGATIVE mg/dL
Leukocytes,Ua: NEGATIVE
Nitrite: NEGATIVE
Protein, ur: NEGATIVE mg/dL
Specific Gravity, Urine: 1.008 (ref 1.005–1.030)
pH: 7 (ref 5.0–8.0)

## 2023-11-01 LAB — CBG MONITORING, ED: Glucose-Capillary: 93 mg/dL (ref 70–99)

## 2023-11-01 LAB — BASIC METABOLIC PANEL
Anion gap: 12 (ref 5–15)
BUN: 12 mg/dL (ref 8–23)
CO2: 24 mmol/L (ref 22–32)
Calcium: 9.7 mg/dL (ref 8.9–10.3)
Chloride: 104 mmol/L (ref 98–111)
Creatinine, Ser: 0.46 mg/dL (ref 0.44–1.00)
GFR, Estimated: >60 mL/min (ref >60)
Glucose, Bld: 103 mg/dL — ABNORMAL HIGH (ref 70–99)
Potassium: 3.7 mmol/L (ref 3.5–5.1)
Sodium: 140 mmol/L (ref 135–145)

## 2023-11-01 LAB — CBC
HCT: 40 % (ref 36.0–46.0)
Hemoglobin: 12.5 g/dL (ref 12.0–15.0)
MCH: 29.7 pg (ref 26.0–34.0)
MCHC: 31.3 g/dL (ref 30.0–36.0)
MCV: 95 fL (ref 80.0–100.0)
Platelets: 275 10*3/uL (ref 150–400)
RBC: 4.21 MIL/uL (ref 3.87–5.11)
RDW: 15.5 % (ref 11.5–15.5)
WBC: 8 10*3/uL (ref 4.0–10.5)
nRBC: 0 % (ref 0.0–0.2)

## 2023-11-01 MED ORDER — ONDANSETRON HCL 4 MG PO TABS: 4.0000 mg | ORAL_TABLET | Freq: Three times a day (TID) | ORAL | 0 refills | Status: DC | PRN

## 2023-11-01 MED ORDER — MECLIZINE HCL 25 MG PO TABS
25.0000 mg | ORAL_TABLET | Freq: Once | ORAL | Status: AC
  Administered 2023-11-01: 25 mg via ORAL
  Filled 2023-11-01: qty 1

## 2023-11-01 MED ORDER — METOCLOPRAMIDE HCL 10 MG PO TABS
10.0000 mg | ORAL_TABLET | Freq: Once | ORAL | Status: AC
Start: 2023-11-01 — End: 2023-11-01
  Administered 2023-11-01: 10 mg via ORAL
  Filled 2023-11-01: qty 1

## 2023-11-01 NOTE — Discharge Instructions (Addendum)
You are leaving AGAINST MEDICAL ADVICE.  He change her mind and would like reevaluation we will happily see you here in the emergency department.  Please follow-up with your primary doctor and we have given you a referral to neurology.  Please return immediately if develop fevers, chills, chest pain, shortness of breath, worsening vertigo, headache, vision changes, facial droop, unilateral weakness or any new or worsening symptoms that are concerning to you.

## 2023-11-01 NOTE — ED Provider Notes (Signed)
[facility] AT [facility] Provider Note   CSN: [id number] Arrival date & time: 11/01/23  1115     History  Chief Complaint  Patient presents with   Dizziness   Nausea    Samantha Benton is a 67 y.o. female.  40 67 year old female present emergency department for dizziness and nausea.  Reports symptoms over the past week.  Her dizziness is quite vague and she has a hard time describing.  Seems to be a mixture of vertigo but also some lightheadedness/near syncope.  Reports symptoms worse upon standing, but resolves.  However if she closes her eyes she does feel that she states the room is spinning around here.  No difficulty walking.  Notes she has had a cough for the past week as well.  No other fevers.  No rash.   Dizziness      Home Medications Prior to Admission medications   Medication Sig Start Date End Date Taking? Authorizing Provider  benzonatate (TESSALON) 100 MG capsule Take 1 capsule (100 mg total) by mouth every 8 (eight) hours. 11/21/20   Hall-Potvin, [place], PA-C  diclofenac (VOLTAREN) 75 MG EC tablet Take 1 tablet (75 mg total) by mouth 2 (two) times daily. 05/02/22   Tarry Kos, MD  meloxicam (MOBIC) 15 MG tablet Take 15 mg by mouth daily.    [provider]  omeprazole (PRILOSEC) 40 MG capsule Take 40 mg by mouth daily as needed. For acid reflux. 04/16/17   [provider]      Allergies    Penicillins and Nabumetone    Review of Systems   Review of Systems  Neurological:  Positive for dizziness.    Physical Exam Updated Vital Signs BP (!) 160/80 (BP Location: Right Arm)   Pulse 80   Temp 98 F (36.7 C) (Oral)   Resp 18   Ht 5\' 5"  (1.651 m)   Wt 61.2 kg   SpO2 99%   BMI 22.47 kg/m  Physical Exam Vitals and nursing note reviewed.  Constitutional:      General: She is not in acute distress.    Appearance: She is not toxic-appearing.  HENT:     Head: Normocephalic.     Mouth/Throat:      Mouth: Mucous membranes are moist.  Eyes:     Extraocular Movements: Extraocular movements intact.     Conjunctiva/sclera: Conjunctivae normal.  Cardiovascular:     Rate and Rhythm: Normal rate and regular rhythm.  Pulmonary:     Effort: Pulmonary effort is normal.     Breath sounds: Normal breath sounds. No wheezing, rhonchi or rales.  Abdominal:     General: Abdomen is flat. There is no distension.     Tenderness: There is no abdominal tenderness. There is no guarding or rebound.  Musculoskeletal:        General: Normal range of motion.  Skin:    General: Skin is warm and dry.     Capillary Refill: Capillary refill takes less than 2 seconds.  Neurological:     Mental Status: She is alert and oriented to person, place, and time.  Psychiatric:        Mood and Affect: Mood normal.        Behavior: Behavior normal.     ED Results / Procedures / Treatments   Labs (all labs ordered are listed, but only abnormal results are displayed) Labs Reviewed  BASIC METABOLIC PANEL - Abnormal; Notable for the following components:  Result Value   Glucose, Bld 103 (*)    All other components within normal limits  URINALYSIS, ROUTINE W REFLEX MICROSCOPIC - Abnormal; Notable for the following components:   Color, Urine STRAW (*)    All other components within normal limits  CBC  CBG MONITORING, ED    EKG None  Radiology DG Chest 2 View  Result Date: 11/01/2023 CLINICAL DATA:  Provided history: Shortness of breath. Additional history provided: Dizziness. Nausea. Difficulty breathing. EXAM: CHEST - 2 VIEW COMPARISON:  Prior chest radiographs 11/21/2020 and earlier. FINDINGS: Heart size within normal limits. No appreciable airspace consolidation or pulmonary edema. No evidence of pleural effusion or pneumothorax. No acute osseous abnormality identified. Spondylosis and S-shaped curvature of the visualized thoracolumbar spine. IMPRESSION: 1. No evidence of active cardiopulmonary disease.  2. Spondylosis and S-shaped curvature of the visualized thoracolumbar spine. Electronically Signed   By: Jackey Loge D.O.   On: 11/01/2023 17:29    Procedures Procedures    Medications Ordered in ED Medications  metoCLOPramide (REGLAN) tablet 10 mg (10 mg Oral Given 11/01/23 1541)  meclizine (ANTIVERT) tablet 25 mg (25 mg Oral Given 11/01/23 1541)    ED Course/ Medical Decision Making/ A&P Clinical Course as of 11/01/23 1744  Thu Nov 01, 2023  1737 Patient reevaluated after medications, reports nausea has improved, but still states she has some vertigo/lightheadedness.  Offered patient to get advanced imaging such as CTA/MRI to evaluate for central cause of her symptoms.  She stated that she does not want to get scans and would like to leave.  She is alert and oriented x 3, clinically sober and has capacity in my clinical opinion.  Voiced my concern that we could be missing posterior stroke.  She is made aware of the risks of leaving to include permanent disability or even death.  She voiced understanding and still would like to leave.  She will be leaving AGAINST MEDICAL ADVICE at this time. [TY]    Clinical Course User Index [TY] Coral Spikes, DO                                 Medical Decision Making C68-year-old female to the emergency department with vertigo and nausea.  She is afebrile, hypertensive, maintaining ox saturation on room air.  Physical exam with no localizing neurodeficits.  Labs independently reviewed with no leukocytosis to suggest systemic infection.  No significant metabolic derangements on her BMP.  Normal kidney function.  UA negative for UTI.  Chest x-ray on my independent interpretation with with no overt pneumonia.  Give patient meclizine and metoclopramide and reevaluate.  May need to advanced imaging.  Amount and/or Complexity of Data Reviewed Independent Historian:     Details: Spouse notes recent death of the mother External Data Reviewed:     Details:  Does have dizziness documented as a problem on her problem list. Labs: ordered. Decision-making details documented in ED Course. Radiology: ordered. Decision-making details documented in ED Course.  Risk Prescription drug management.         Final Clinical Impression(s) / ED Diagnoses Final diagnoses:  None    Rx / DC Orders ED Discharge Orders     None         Coral Spikes, DO 11/01/23 1744

## 2023-11-01 NOTE — ED Triage Notes (Signed)
Pt presents with c/o dizziness, nausea, and reports she feels like she is having a hard time breathing. Pt is in NAD at this time in triage. Pt reports she has felt this way since Tuesday of this week.

## 2023-11-01 NOTE — ED Provider Triage Note (Cosign Needed)
Emergency Medicine Provider Triage Evaluation Note  Samantha Benton , a 67 y.o. female  was evaluated in triage.  Pt complains of dizziness, Pt reports she feels nauseated  Review of Systems  Positive: nausea Negative: fever  Physical Exam  BP (!) 162/82 (BP Location: Left Arm)   Pulse 82   Temp 98 F (36.7 C) (Oral)   Resp 16   SpO2 99%  Gen:   Awake, no distress   Resp:  Normal effort  MSK:   Moves extremities without difficulty  Other:    Medical Decision Making  Medically screening exam initiated at 12:13 PM.  Appropriate orders placed.  Samantha Benton was informed that the remainder of the evaluation will be completed by another provider, this initial triage assessment does not replace that evaluation, and the importance of remaining in the ED until their evaluation is complete.     Samantha Benton, [place] 11/01/23 1214

## 2023-11-21 ENCOUNTER — Other Ambulatory Visit: Payer: Self-pay | Admitting: Diagnostic Radiology

## 2023-11-21 DIAGNOSIS — Z1231 Encounter for screening mammogram for malignant neoplasm of breast: Secondary | ICD-10-CM

## 2023-12-05 ENCOUNTER — Ambulatory Visit
Admission: RE | Admit: 2023-12-05 | Discharge: 2023-12-05 | Disposition: A | Discharge status: Home / Self Care | Payer: Medicare PPO | Source: Ambulatory Visit | Attending: Diagnostic Radiology | Admitting: Diagnostic Radiology

## 2023-12-05 DIAGNOSIS — Z1231 Encounter for screening mammogram for malignant neoplasm of breast: Secondary | ICD-10-CM

## 2023-12-10 DIAGNOSIS — Z0000 Encounter for general adult medical examination without abnormal findings: Secondary | ICD-10-CM | POA: Diagnosis not present

## 2023-12-10 DIAGNOSIS — E7800 Pure hypercholesterolemia, unspecified: Secondary | ICD-10-CM | POA: Diagnosis not present

## 2023-12-10 DIAGNOSIS — M2170 Unequal limb length (acquired), unspecified site: Secondary | ICD-10-CM | POA: Diagnosis not present

## 2023-12-10 DIAGNOSIS — M5136 Other intervertebral disc degeneration, lumbar region with discogenic back pain only: Secondary | ICD-10-CM | POA: Diagnosis not present

## 2023-12-10 DIAGNOSIS — E559 Vitamin D deficiency, unspecified: Secondary | ICD-10-CM | POA: Diagnosis not present

## 2023-12-10 DIAGNOSIS — Z23 Encounter for immunization: Secondary | ICD-10-CM | POA: Diagnosis not present

## 2023-12-10 DIAGNOSIS — D649 Anemia, unspecified: Secondary | ICD-10-CM | POA: Diagnosis not present

## 2023-12-10 DIAGNOSIS — G8929 Other chronic pain: Secondary | ICD-10-CM | POA: Diagnosis not present

## 2023-12-10 DIAGNOSIS — R7303 Prediabetes: Secondary | ICD-10-CM | POA: Diagnosis not present

## 2023-12-17 ENCOUNTER — Telehealth: Payer: Self-pay | Admitting: Gastroenterology

## 2023-12-17 NOTE — Telephone Encounter (Signed)
Good morning Dr. Barron Alvine,  Supervising AM 12/23  We received a referral for this patient to be scheduled for a colonoscopy. Patient last had colonoscopy in 10/2021 with Dr. Jason Fila in [facility]. Patient stated she is wanting to transfer to [facility] due to not wanting to drive to [facility]. Records were scanned into media for you to review. Would you please advise for scheduling?  Thank you

## 2023-12-21 ENCOUNTER — Encounter: Payer: Self-pay | Admitting: Gastroenterology

## 2023-12-21 NOTE — Telephone Encounter (Signed)
Left message for patient to schedule an office visit. 

## 2024-02-06 ENCOUNTER — Ambulatory Visit: Payer: Medicare PPO | Admitting: Neurology

## 2024-02-06 ENCOUNTER — Encounter: Payer: Self-pay | Admitting: Neurology

## 2024-02-06 VITALS — BP 149/87 | HR 87 | Ht 60.0 in | Wt 135.5 lb

## 2024-02-06 DIAGNOSIS — R419 Unspecified symptoms and signs involving cognitive functions and awareness: Secondary | ICD-10-CM

## 2024-02-06 NOTE — Progress Notes (Signed)
[facility]  PATIENT: Samantha Benton DOB: 1956-06-09  REQUESTING CLINICIAN: Pahwani, Kasandra Knudsen, MD HISTORY FROM: Patient  REASON FOR VISIT: Vertigo/Memory concerns    HISTORICAL  CHIEF COMPLAINT:  Chief Complaint  Patient presents with   New Patient (Initial Visit)    Rm12, alone, NP internal referral for Vertigo:pt stated feels dizzy at times accompanied w/nausea, has: 10/30 days triggers:unidentifiable, pt stated concerned w/memory loss: moca score was 24    HISTORY OF PRESENT ILLNESS:  This is a 68 year old woman past medical history of GERD, anxiety who was referred after an episode of dizziness in the hospital.  Patient reports on November 7 she presented to the hospital for one week history of dizziness and nausea.  Since discharge from the hospital she has not had an additional event.  This was a one-time event.   Her main concern today is her memory.  She tells me that for the past 54-months, she has noted some changes in he memory. She has to think hard about everything.  She tells me she is not losing memory meaning she is not forgetting the information, or recent conversation, or appointment or what she needs to do but she just has to think very hard for it.  She is independent in all activities of daily living.  She tells me that her family has not complained about her memory.  She still drives, denies any issues, denies being late on her bills, denies any issues with cooking or cleaning or shopping.  She tells me that her father had dementia and she had to take care of him and she is aware that she is not exhibiting those symptoms or signs.   OTHER MEDICAL CONDITIONS: GERD, Anxiety    REVIEW OF SYSTEMS: Full 14 system review of systems performed and negative with exception of: As noted in the HPI   ALLERGIES: Allergies  Allergen Reactions   Penicillins Hives and Swelling    Throat swelling Has patient had a PCN reaction causing immediate rash,  facial/tongue/throat swelling, SOB or lightheadedness with hypotension: Yes Has patient had a PCN reaction causing severe rash involving mucus membranes or skin necrosis: No Has patient had a PCN reaction that required hospitalization: No Has patient had a PCN reaction occurring within the last 10 years: No If all of the above answers are "NO", then may proceed with Cephalosporin use.    Nabumetone     HA, feet tingles    HOME MEDICATIONS: Outpatient Medications Prior to Visit  Medication Sig Dispense Refill   meloxicam (MOBIC) 15 MG tablet Take 15 mg by mouth daily.     omeprazole (PRILOSEC) 40 MG capsule Take 40 mg by mouth daily as needed. For acid reflux.     benzonatate (TESSALON) 100 MG capsule Take 1 capsule (100 mg total) by mouth every 8 (eight) hours. 21 capsule 0   diclofenac (VOLTAREN) 75 MG EC tablet Take 1 tablet (75 mg total) by mouth 2 (two) times daily. 30 tablet 2   ondansetron (ZOFRAN) 4 MG tablet Take 1 tablet (4 mg total) by mouth every 8 (eight) hours as needed for nausea or vomiting. 12 tablet 0   No facility-administered medications prior to visit.    PAST MEDICAL HISTORY: Past Medical History:  Diagnosis Date   Anemia    hx. of   Anxiety    pt. denies at preop appt.   Arthritis    right knee   Family history of adverse reaction to anesthesia  daughter woke up during surgery   GERD (gastroesophageal reflux disease)    PONV (postoperative nausea and vomiting)     PAST SURGICAL HISTORY: Past Surgical History:  Procedure Laterality Date   CARPAL TUNNEL RELEASE  2008   CHOLECYSTECTOMY  1970   gallstone removal     KNEE ARTHROSCOPY  2002   right knee   KNEE ARTHROTOMY Left 12/09/2014   Procedure: LEFT KNEE ARTHROTOMY WITH SCAR EXCISION;  Surgeon: Loanne Drilling, MD;  Location: [facility];  Service: Orthopedics;  Laterality: Left;   KNEE CLOSED REDUCTION Left 05/12/2015   Procedure: LEFT KNEE CLOSED MANIPULATION ;  Surgeon: Ollen Gross, MD;  Location:  [facility];  Service: Orthopedics;  Laterality: Left;   LEFT HEART CATHETERIZATION WITH CORONARY ANGIOGRAM N/A 11/13/2014   Procedure: LEFT HEART CATHETERIZATION WITH CORONARY ANGIOGRAM;  Surgeon: Corky Crafts, MD;  Location: [facility] C[facility] LAB;  Service: Cardiovascular;  Laterality: N/A;   TOTAL KNEE ARTHROPLASTY Right 06/04/2017   Procedure: RIGHT TOTAL KNEE ARTHROPLASTY;  Surgeon: Ollen Gross, MD;  Location: [facility];  Service: Orthopedics;  Laterality: Right;  Adductor Block   TUBAL LIGATION      FAMILY HISTORY: Family History  Problem Relation Age of Onset   Heart disease Mother 59       MI   Cancer Mother 36       breast ca   Breast cancer Mother    Deep vein thrombosis Father    Heart disease Sister 1       MI    SOCIAL HISTORY: Social History   Socioeconomic History   Marital status: Married    Spouse name: Not on file   Number of children: Not on file   Years of education: Not on file   Highest education level: Not on file  Occupational History   Occupation: Housekeeping    Employer: [employer]  Tobacco Use   Smoking status: Never   Smokeless tobacco: Never  Vaping Use   Vaping status: Never Used  Substance and Sexual Activity   Alcohol use: No   Drug use: No   Sexual activity: Not Currently    Partners: Male  Other Topics Concern   Not on file  Social History Narrative   Lives with husband.  2 children- grown.  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   Social Drivers of Corporate investment banker Strain: Not on [employer] Insecurity: Not on file  Transportation Needs: Not on file  Physical Activity: Not on file  Stress: Not on file  Social Connections: Unknown (05/08/2022)   [facility] from [facility]   Social Network    Social Network: Not on file  Intimate Partner Violence: Unknown (03/29/2022)   Received from [facility], [facility]   HITS    Physically Hurt: Not on file    Insult or Talk Down To: Not on file    Threaten Physical  Harm: Not on file    Scream or Curse: Not on file    PHYSICAL EXAM  GENERAL EXAM/CONSTITUTIONAL: Vitals:  Vitals:   02/06/24 0821 02/06/24 0822 02/06/24 0823  BP: (!) 164/89 (!) 156/94 (!) 149/87  Pulse: 83 82 87  Weight: 135 lb 8 oz (61.5 kg)    Height: 5' (1.524 m)     Body mass index is 26.46 kg/m. Wt Readings from Last 3 Encounters:  02/06/24 135 lb 8 oz (61.5 kg)  11/01/23 135 lb (61.2 kg)  04/27/22 145 lb (65.8 kg)  Patient is in no distress; well developed, nourished and groomed; neck is supple  MUSCULOSKELETAL: Gait, strength, tone, movements noted in Neurologic exam below  NEUROLOGIC: MENTAL STATUS:      No data to display         awake, alert, oriented to person, place and time recent and remote memory intact normal attention and concentration language fluent, comprehension intact, naming intact fund of knowledge appropriate     02/06/2024    8:26 AM  [facility] Cognitive Assessment   Visuospatial/ Executive (0/5) 4  Naming (0/3) 2  Attention: Read list of digits (0/2) 2  Attention: Read list of letters (0/1) 1  Attention: Serial 7 subtraction starting at 100 (0/3) 2  Language: Repeat phrase (0/2) 1  Language : Fluency (0/1) 1  Abstraction (0/2) 2  Delayed Recall (0/5) 3  Orientation (0/6) 6  Total 24    CRANIAL NERVE:  2nd, 3rd, 4th, 6th - Visual fields full to confrontation, extraocular muscles intact, no nystagmus 5th - facial sensation symmetric 7th - facial strength symmetric 8th - hearing intact 9th - palate elevates symmetrically, uvula midline 11th - shoulder shrug symmetric 12th - tongue protrusion midline  MOTOR:  normal bulk and tone, full strength in the BUE, BLE  SENSORY:  normal and symmetric to light touch  COORDINATION:  finger-nose-finger, fine finger movements normal  REFLEXES:  deep tendon reflexes present and symmetric  GAIT/STATION:  normal   DIAGNOSTIC DATA (LABS, IMAGING, TESTING) - I reviewed patient  records, labs, notes, testing and imaging myself where available.  Lab Results  Component Value Date   WBC 8.0 11/01/2023   HGB 12.5 11/01/2023   HCT 40.0 11/01/2023   MCV 95.0 11/01/2023   PLT 275 11/01/2023      Component Value Date/Time   NA 140 11/01/2023 1134   NA 141 11/21/2020 0902   NA 139 05/01/2012 0240   K 3.7 11/01/2023 1134   K 4.5 05/01/2012 0240   CL 104 11/01/2023 1134   CL 104 05/01/2012 0240   CO2 24 11/01/2023 1134   CO2 29 05/01/2012 0240   GLUCOSE 103 (H) 11/01/2023 1134   GLUCOSE 112 (H) 05/01/2012 0240   BUN 12 11/01/2023 1134   BUN 13 11/21/2020 0902   BUN 10 05/01/2012 0240   CREATININE 0.46 11/01/2023 1134   CREATININE 0.53 (L) 05/01/2012 0240   CREATININE 0.63 01/25/2012 0933   CALCIUM 9.7 11/01/2023 1134   CALCIUM 8.9 05/01/2012 0240   PROT 6.4 11/21/2020 0902   ALBUMIN 4.0 11/21/2020 0902   AST 10 11/21/2020 0902   ALT 9 11/21/2020 0902   ALKPHOS 101 11/21/2020 0902   BILITOT 0.3 11/21/2020 0902   GFRNONAA >60 11/01/2023 1134   GFRNONAA >60 05/01/2012 0240   GFRAA 111 11/21/2020 0902   GFRAA >60 05/01/2012 0240   Lab Results  Component Value Date   CHOL 200 01/25/2012   HDL 74 01/25/2012   LDLCALC 117 (H) 01/25/2012   TRIG 44 01/25/2012   CHOLHDL 2.7 01/25/2012   No results found for: "HGBA1C" No results found for: "VITAMINB12" Lab Results  Component Value Date   TSH 0.482 01/25/2012      ASSESSMENT AND PLAN  68 y.o. year old female with history of GERD, anxiety who is presenting after an episode of dizziness back in November, resolved.  She has not had any additional events since that one time. In terms of her memory, she does have normal cognition, I have informed patient that as of  now, she has not reported any alarming or concerning symptoms.  We discussed ways of reducing the risk of developing dementia including exercise, keeping a good diet, good health, and good sleep.  Plan will be for patient to continue following up  with PCP and return as needed.   1. Cognitive complaints with normal exam      Patient Instructions  Continue current medications  Continue to follow up with PCP  Return as needed   There are well-accepted and sensible ways to reduce risk for Alzheimers disease and other degenerative brain disorders .  Exercise Daily Walk A daily 20 minute walk should be part of your routine. Disease related apathy can be a significant roadblock to exercise and the only way to overcome this is to make it a daily routine and perhaps have a reward at the end (something your loved one loves to eat or drink perhaps) or a personal trainer coming to the home can also be very useful. Most importantly, the patient is much more likely to exercise if the caregiver / spouse does it with him/her. In general a structured, repetitive schedule is best.  General Health: Any diseases which effect your body will effect your brain such as a pneumonia, urinary infection, blood clot, heart attack or stroke. Keep contact with your primary care doctor for regular follow ups.  Sleep. A good nights sleep is healthy for the brain. Seven hours is recommended. If you have insomnia or poor sleep habits we can give you some instructions. If you have sleep apnea wear your mask.  Diet: Eating a heart healthy diet is also a good idea; fish and poultry instead of red meat, nuts (mostly non-peanuts), vegetables, fruits, olive oil or canola oil (instead of butter), minimal salt (use other spices to flavor foods), whole grain rice, bread, cereal and pasta and wine in moderation.Research is now showing that the MIND diet, which is a combination of The Mediterranean diet and the DASH diet, is beneficial for cognitive processing and longevity. Information about this diet can be found in The MIND Diet, a book by Alonna Minium, MS, RDN, and online at [employer]  Finances, Power of [address] and Advance Directives: You  should consider putting legal safeguards in place with regard to financial and medical decision making. While the spouse always has power of attorney for medical and financial issues in the absence of any form, you should consider what you want in case the spouse / caregiver is no longer around or capable of making decisions.   No orders of the defined types were placed in this encounter.   No orders of the defined types were placed in this encounter.   Return if symptoms worsen or fail to improve.    Windell Norfolk, MD 02/06/2024, 9:18 AM  [facility] [address], [address] [place], [place] [address] [phone]

## 2024-02-06 NOTE — Patient Instructions (Signed)
Continue current medications  Continue to follow up with PCP  Return as needed   There are well-accepted and sensible ways to reduce risk for Alzheimers disease and other degenerative brain disorders .  Exercise Daily Walk A daily 20 minute walk should be part of your routine. Disease related apathy can be a significant roadblock to exercise and the only way to overcome this is to make it a daily routine and perhaps have a reward at the end (something your loved one loves to eat or drink perhaps) or a personal trainer coming to the home can also be very useful. Most importantly, the patient is much more likely to exercise if the caregiver / spouse does it with him/her. In general a structured, repetitive schedule is best.  General Health: Any diseases which effect your body will effect your brain such as a pneumonia, urinary infection, blood clot, heart attack or stroke. Keep contact with your primary care doctor for regular follow ups.  Sleep. A good nights sleep is healthy for the brain. Seven hours is recommended. If you have insomnia or poor sleep habits we can give you some instructions. If you have sleep apnea wear your mask.  Diet: Eating a heart healthy diet is also a good idea; fish and poultry instead of red meat, nuts (mostly non-peanuts), vegetables, fruits, olive oil or canola oil (instead of butter), minimal salt (use other spices to flavor foods), whole grain rice, bread, cereal and pasta and wine in moderation.Research is now showing that the MIND diet, which is a combination of The Mediterranean diet and the DASH diet, is beneficial for cognitive processing and longevity. Information about this diet can be found in The MIND Diet, a book by Alonna Minium, MS, RDN, and online at [employer]  Finances, Power of [address] and Advance Directives: You should consider putting legal safeguards in place with regard to financial and medical decision making. While  the spouse always has power of attorney for medical and financial issues in the absence of any form, you should consider what you want in case the spouse / caregiver is no longer around or capable of making decisions.

## 2024-03-06 ENCOUNTER — Ambulatory Visit: Payer: Medicare PPO | Admitting: Gastroenterology

## 2024-03-06 ENCOUNTER — Encounter: Payer: Self-pay | Admitting: Gastroenterology

## 2024-03-06 VITALS — BP 140/80 | HR 89 | Ht 60.0 in | Wt 136.0 lb

## 2024-03-06 DIAGNOSIS — Z860100 Personal history of colon polyps, unspecified: Secondary | ICD-10-CM | POA: Diagnosis not present

## 2024-03-06 DIAGNOSIS — Z1211 Encounter for screening for malignant neoplasm of colon: Secondary | ICD-10-CM

## 2024-03-06 DIAGNOSIS — Z01818 Encounter for other preprocedural examination: Secondary | ICD-10-CM | POA: Diagnosis not present

## 2024-03-06 MED ORDER — SUFLAVE 178.7 G PO SOLR: 1.0000 | Freq: Once | ORAL | 0 refills | Status: AC

## 2024-03-06 NOTE — Progress Notes (Signed)
 Chief Complaint: Colon cancer screening   Referring Provider:     Ollen Bowl, MD   HPI:     Samantha Benton is a 68 y.o. female with a history of GERD, anxiety, cholecystectomy, referred to the Gastroenterology Clinic for evaluation of colonoscopy for ongoing CRC screening.  Previously followed at [facility].  Was last seen in that office on 12/30/2020 for evaluation of 1-2 months of abdominal pain.  Was on MiraLAX and Bentyl along with omeprazole prn reflux symptoms.  Plan was for CT abdomen/pelvis and EGD.  Based on records, does not appear that those studies were done (perhaps symptoms resolved?) at that time.   - Reports having colonoscopy x2 prior to 2017 which were notable for polyps per patient. No reports available for review.  - 09/15/2016: Colonoscopy: Negative exam.  Placed on 5-year recall because of polyp history  - 11/15/2021: Colonoscopy: Limited report available for review demonstrates incomplete colon to ascending colon and recommended air-contrast barium enema  - 12/09/2021: Double contrast barium enema: No constricting lesions or polyps.  Normal haustral pattern, mucosal surfaces are smooth   Today, she states she is o/w w/o any GI sxs. No hematochezia, melena, change in bowel habits, etc.   Reviewed most recent labs were 10/2023: Normal CBC, BMP.  No other recent abdominal imaging for review.  No known family history of CRC, GI malignancy, liver disease, pancreatic disease, or IBD.     Past Medical History:  Diagnosis Date   Anemia    hx. of   Anxiety    pt. denies at preop appt.   Arthritis    right knee   Family history of adverse reaction to anesthesia    daughter woke up during surgery   GERD (gastroesophageal reflux disease)    PONV (postoperative nausea and vomiting)      Past Surgical History:  Procedure Laterality Date   CARPAL TUNNEL RELEASE  2008   CHOLECYSTECTOMY  1970   gallstone removal     KNEE ARTHROSCOPY  2002    right knee   KNEE ARTHROTOMY Left 12/09/2014   Procedure: LEFT KNEE ARTHROTOMY WITH SCAR EXCISION;  Surgeon: Loanne Drilling, MD;  Location: [facility];  Service: Orthopedics;  Laterality: Left;   KNEE CLOSED REDUCTION Left 05/12/2015   Procedure: LEFT KNEE CLOSED MANIPULATION ;  Surgeon: Ollen Gross, MD;  Location: [facility];  Service: Orthopedics;  Laterality: Left;   LEFT HEART CATHETERIZATION WITH CORONARY ANGIOGRAM N/A 11/13/2014   Procedure: LEFT HEART CATHETERIZATION WITH CORONARY ANGIOGRAM;  Surgeon: Corky Crafts, MD;  Location: [facility] CATH LAB;  Service: Cardiovascular;  Laterality: N/A;   TOTAL KNEE ARTHROPLASTY Right 06/04/2017   Procedure: RIGHT TOTAL KNEE ARTHROPLASTY;  Surgeon: Ollen Gross, MD;  Location: [facility];  Service: Orthopedics;  Laterality: Right;  Adductor Block   TUBAL LIGATION     Family History  Problem Relation Age of Onset   Heart disease Mother 31       MI   Cancer Mother 1       breast ca   Breast cancer Mother    Deep vein thrombosis Father    Heart disease Sister 1       MI   Social History   Tobacco Use   Smoking status: Never   Smokeless tobacco: Never  Vaping Use   Vaping status: Never Used  Substance Use Topics   Alcohol use: No  Drug use: No   Current Outpatient Medications  Medication Sig Dispense Refill   meloxicam (MOBIC) 15 MG tablet Take 15 mg by mouth daily.     omeprazole (PRILOSEC) 40 MG capsule Take 40 mg by mouth daily as needed. For acid reflux.     No current facility-administered medications for this visit.   Allergies  Allergen Reactions   Penicillins Hives and Swelling    Throat swelling Has patient had a PCN reaction causing immediate rash, facial/tongue/throat swelling, SOB or lightheadedness with hypotension: Yes Has patient had a PCN reaction causing severe rash involving mucus membranes or skin necrosis: No Has patient had a PCN reaction that required hospitalization: No Has patient had a PCN reaction  occurring within the last 10 years: No If all of the above answers are "NO", then may proceed with Cephalosporin use.    Nabumetone     HA, feet tingles     Review of Systems: All systems reviewed and negative except where noted in HPI.     Physical Exam:    Wt Readings from Last 3 Encounters:  02/06/24 135 lb 8 oz (61.5 kg)  11/01/23 135 lb (61.2 kg)  04/27/22 145 lb (65.8 kg)    There were no vitals taken for this visit. Constitutional:  Pleasant, in no acute distress. Psychiatric: Normal mood and affect. Behavior is normal. EENT: Pupils normal.  Conjunctivae are normal. No scleral icterus. Neck supple. No cervical LAD. Cardiovascular: Normal rate, regular rhythm. No edema Pulmonary/chest: Effort normal and breath sounds normal. No wheezing, rales or rhonchi. Abdominal: Soft, nondistended, nontender. Bowel sounds active throughout. There are no masses palpable. No hepatomegaly. Neurological: Alert and oriented to person place and time. Skin: Skin is warm and dry. No rashes noted.   ASSESSMENT AND PLAN;   1) Colon cancer screening 2) Hx of colon polyps 3) Hx of failed/incomplete colonoscopy  68 year old female presents to the GI clinic to discuss ongoing CRC screening.  Complete colonoscopy 08/2016 which was normal, but recommended repeat in 5 years due to prior history of polyps (none of those prior results available for review).  Subsequent incomplete colonoscopy in 10/2021 and went for barium enema in 11/2021 which was unremarkable and no polyps.  Now due for repeat colonoscopy.  Discussed potential modalities for ongoing CRC screening, to include repeat attempt at optical colonoscopy, virtual colonoscopy, and Cologuard.  Discussed the pros/cons of each of these modalities, and she would like to proceed with repeat optical colonoscopy.  Plan for the following:  -Schedule colonoscopy - Plan for abdominal binder placement in the preoperative area along with water  immersion during colonoscopy - If unable to complete colonoscopy, will plan for Virtual Colonoscopy  The indications, risks, and benefits of colonoscopy were explained to the patient in detail. Risks include but are not limited to bleeding, perforation, adverse reaction to medications, and cardiopulmonary compromise. Sequelae include but are not limited to the possibility of surgery, hospitalization, and mortality. The patient verbalized understanding and wished to proceed. All questions answered, referred to the scheduler and bowel prep ordered. Further recommendations pending results of the exam.     Shellia Cleverly, DO, FACG  03/06/2024, 1:07 PM   Pahwani, Kasandra Knudsen, MD

## 2024-03-06 NOTE — Patient Instructions (Signed)
 _______________________________________________________  If your blood pressure at your visit was 140/90 or greater, please contact your primary care physician to follow up on this. _______________________________________________________  If you are age 68 or older, your body mass index should be between 23-30. Your Body mass index is 26.56 kg/m. If this is out of the aforementioned range listed, please consider follow up with your Primary Care Provider. ________________________________________________________  The [facility] providers would like to encourage you to use [facility] to communicate with providers for non-urgent requests or questions.  Due to long hold times on the telephone, sending your provider a message by [facility] may be a faster and more efficient way to get a response.  Please allow 48 business hours for a response.  Please remember that this is for non-urgent requests.  _______________________________________________________  Bonita Quin have been scheduled for a colonoscopy. Please follow written instructions given to you at your visit today.   If you use inhalers (even only as needed), please bring them with you on the day of your procedure.  DO NOT TAKE 7 DAYS PRIOR TO TEST- Trulicity (dulaglutide) Ozempic, Wegovy (semaglutide) Mounjaro (tirzepatide) Bydureon Bcise (exanatide extended release)  DO NOT TAKE 1 DAY PRIOR TO YOUR TEST Rybelsus (semaglutide) Adlyxin (lixisenatide) Victoza (liraglutide) Byetta (exanatide) ___________________________________________________________________________  Bonita Quin will receive your bowel preparation through [facility], which ensures the lowest copay and home delivery, with outreach via text or call from an [phone] number. Please respond promptly to avoid rescheduling of your procedure. If you are interested in alternative options or have any questions regarding your prep, please contact them at  [phone] ____________________________________________________________________________  Your Provider Has Sent Your Bowel Prep Regimen To [facility]   [facility] will contact you to verify your information and collect your copay, if applicable. Enjoy the comfort of your home while your prescription is mailed to you, FREE of any shipping charges.   [facility] accepts all major insurance benefits and applies discounts & coupons.  Have additional questions?   Chat: [email or web address] Call: [phone] Email: [email or web address] NCPDP: [id number]  How will [facility] contact you?  With a Welcome phone call,  a Welcome text and a checkout link in text form.  Texts you receive from [phone] Are NOT Spam.  *To set up delivery, you must complete the checkout process via link or speak to one of the patient care representatives. If [facility] is unable to reach you, your prescription may be delayed.  To avoid long hold times on the phone, you may also utilize the secure chat feature on the [facility] website to request that they call you back for transaction completion or to expedite your concerns.  Due to recent changes in healthcare laws, you may see the results of your imaging and laboratory studies on MyChart before your provider has had a chance to review them.  We understand that in some cases there may be results that are confusing or concerning to you. Not all laboratory results come back in the same time frame and the provider may be waiting for multiple results in order to interpret others.  Please give [place] 48 hours in order for your provider to thoroughly review all the results before contacting the office for clarification of your results.   It was a pleasure to see you today!  Vito Cirigliano, D.O.

## 2024-03-10 ENCOUNTER — Telehealth: Payer: Self-pay | Admitting: Gastroenterology

## 2024-03-10 MED ORDER — SUFLAVE 178.7 G PO SOLR: 1.0000 | Freq: Once | ORAL | 0 refills | Status: DC

## 2024-03-10 NOTE — Telephone Encounter (Signed)
 Inbound call from patient stating that she is scheduled for a colonoscopy on 3/19 and has not received her prep medication from [facility]. Patient stated she contacted them and they advised her that they could send her prep to her pharmacy and they have still not done so. Patient is requesting a call back to discuss. Please advise.

## 2024-03-10 NOTE — Telephone Encounter (Signed)
 Patient spoke with [facility] who informed the patient that they would not have enough time to mail Suflave to the patient.  [facility] told the patient they would send Rx to local pharmacy.  Patient stated that [facility] has not received Rx for Suflave.   Patient made aware that I will send Rx for Suflave to [facility] on [address]  Patient agreed to plan and verbalized understanding.  No further questions or concerns.

## 2024-03-11 MED ORDER — SUFLAVE 178.7 G PO SOLR: 1.0000 | Freq: Once | ORAL | 0 refills | Status: AC

## 2024-03-11 NOTE — Telephone Encounter (Signed)
 Patient called and stated that [facility] to [facility] to [facility] her SuFlave and they told her they will not have anymore until Thursday. Patient is requesting a call back. Please advise.

## 2024-03-11 NOTE — Telephone Encounter (Signed)
 PT called to let us know that [facility] nor [facility] has the Suflave prep and she is supposed to start prepping today. She would like to know what she should do at this point. Please advise.

## 2024-03-11 NOTE — Addendum Note (Signed)
 Addended by: Lamona Curl on: 03/11/2024 10:13 AM   Modules accepted: Orders

## 2024-03-11 NOTE — Telephone Encounter (Signed)
 Returned call to the patient and we dicussed another option for the prep to be sent.  [facility] [address]  Patient advised to contact our office with any further questions or concerns.  Patient agreed to plan and verbalized understanding.  No further questions.

## 2024-03-11 NOTE — Telephone Encounter (Signed)
 Returned call to the patient and explained to her that we would provide her with a SUTAB sample so that she can proceed with procedure tomorrow.  Patient advised of instructions and a copy of printed instructions were left with samples at the front desk.  Patient agreed to plan and verbalized understanding.  No further questions.

## 2024-03-12 ENCOUNTER — Ambulatory Visit: Admitting: Gastroenterology

## 2024-03-12 ENCOUNTER — Encounter: Payer: Self-pay | Admitting: Gastroenterology

## 2024-03-12 VITALS — BP 143/73 | HR 76 | Temp 98.7°F | Resp 17 | Ht 60.0 in | Wt 136.0 lb

## 2024-03-12 DIAGNOSIS — K562 Volvulus: Secondary | ICD-10-CM | POA: Diagnosis not present

## 2024-03-12 DIAGNOSIS — Z1211 Encounter for screening for malignant neoplasm of colon: Secondary | ICD-10-CM

## 2024-03-12 DIAGNOSIS — D123 Benign neoplasm of transverse colon: Secondary | ICD-10-CM | POA: Diagnosis not present

## 2024-03-12 DIAGNOSIS — K640 First degree hemorrhoids: Secondary | ICD-10-CM

## 2024-03-12 DIAGNOSIS — K648 Other hemorrhoids: Secondary | ICD-10-CM

## 2024-03-12 MED ORDER — SODIUM CHLORIDE 0.9 % IV SOLN: 500.0000 mL | INTRAVENOUS | Status: DC

## 2024-03-12 NOTE — Op Note (Signed)
 [facility] Patient Name: Samantha Benton Procedure Date: 03/12/2024 1:02 PM MRN: [id number] Endoscopist: Doristine Locks , MD, [id number] Age: 68 Referring MD:  Date of Birth: [birth year] Gender: Female Account #: [id number] Procedure:                Colonoscopy Indications:              Screening for colorectal malignant neoplasm                           - 09/15/2016: Colonoscopy: Negative exam                           - 11/15/2021: Colonoscopy: Limited report available                            for review demonstrates incomplete colon to                            ascending colon and recommended air-contrast barium                            enema                           - 12/09/2021: Double contrast barium enema: No                            constricting lesions or polyps. Normal haustral                            pattern, mucosal surfaces are smooth Medicines:                Monitored Anesthesia Care Procedure:                Pre-Anesthesia Assessment:                           - Prior to the procedure, a History and Physical                            was performed, and patient medications and                            allergies were reviewed. The patient's tolerance of                            previous anesthesia was also reviewed. The risks                            and benefits of the procedure and the sedation                            options and risks were discussed with the patient.                            All questions were answered, and  informed consent                            was obtained. Prior Anticoagulants: The patient has                            taken no anticoagulant or antiplatelet agents. ASA                            Grade Assessment: II - A patient with mild systemic                            disease. After reviewing the risks and benefits,                            the patient was deemed in satisfactory condition to                             undergo the procedure.                           After obtaining informed consent, the colonoscope                            was passed under direct vision. Throughout the                            procedure, the patient's blood pressure, pulse, and                            oxygen saturations were monitored continuously. The                            Olympus Scope SN: [id number] was introduced through                            the anus and advanced to the the terminal ileum.                            The colonoscopy was performed without difficulty.                            The patient tolerated the procedure well. The                            quality of the bowel preparation was good. The                            terminal ileum, ileocecal valve, appendiceal                            orifice, and rectum were photographed. Scope In: 1:45:25 PM Scope Out: 2:01:03 PM Scope Withdrawal Time: 0 hours 9 minutes 38 seconds  Total Procedure Duration: 0 hours 15 minutes  38 seconds  Findings:                 The perianal and digital rectal examinations were                            normal.                           A 5 mm polyp was found in the proximal transverse                            colon. The polyp was sessile. The polyp was removed                            with a cold snare. Resection and retrieval were                            complete. Estimated blood loss was minimal.                           The sigmoid colon and ascending colon revealed                            significantly excessive looping. An abdominal                            binder was placed prior to starting the procedure.                            Advancing the scope required using manual pressure                            and straightening and shortening the scope to                            obtain bowel loop reduction.                           Non-bleeding internal hemorrhoids were  found during                            retroflexion. The hemorrhoids were small.                           The terminal ileum appeared normal. Complications:            No immediate complications. Estimated Blood Loss:     Estimated blood loss was minimal. Impression:               - One 5 mm polyp in the proximal transverse colon,                            removed with a cold snare. Resected and retrieved.                           -  There was significant looping of the colon.                           - Non-bleeding internal hemorrhoids.                           - The examined portion of the ileum was normal. Recommendation:           - Patient has a contact number available for                            emergencies. The signs and symptoms of potential                            delayed complications were discussed with the                            patient. Return to normal activities tomorrow.                            Written discharge instructions were provided to the                            patient.                           - Resume previous diet.                           - Continue present medications.                           - Await pathology results.                           - Repeat colonoscopy for surveillance based on                            pathology results.                           - Return to GI office PRN. Doristine Locks, MD 03/12/2024 2:11:02 PM

## 2024-03-12 NOTE — Progress Notes (Unsigned)
 GASTROENTEROLOGY PROCEDURE H&P NOTE   Primary Care Physician: Ollen Bowl, MD    Reason for Procedure:   Colon cancer screening  Plan:    colonoscopy  Patient is appropriate for endoscopic procedure(s) in the ambulatory ([facility]) setting.  The nature of the procedure, as well as the risks, benefits, and alternatives were carefully and thoroughly reviewed with the patient. Ample time for discussion and questions allowed. The patient understood, was satisfied, and agreed to proceed.     HPI: Samantha Benton is a 68 y.o. female who presents for colonoscopy for CRC screening.  Patient was most recently seen in the Gastroenterology Clinic on 03/06/2024.  No interval change in medical history since that appointment. Please refer to that note for full details regarding GI history and clinical presentation.   Reports having colonoscopy x2 prior to 2017 which were notable for polyps per patient. No reports available for review.  - 09/15/2016: Colonoscopy: Negative exam.  Placed on 5-year recall because of polyp history  - 11/15/2021: Colonoscopy: Limited report available for review demonstrates incomplete colon to ascending colon and recommended air-contrast barium enema  - 12/09/2021: Double contrast barium enema: No constricting lesions or polyps.  Normal haustral pattern, mucosal surfaces are smooth   Past Medical History:  Diagnosis Date   Anemia    hx. of   Anxiety    pt. denies at preop appt.   Arthritis    right knee   Colon polyp    Family history of adverse reaction to anesthesia    daughter woke up during surgery   Gallstone    GERD (gastroesophageal reflux disease)    PONV (postoperative nausea and vomiting)     Past Surgical History:  Procedure Laterality Date   CARPAL TUNNEL RELEASE  2008   CHOLECYSTECTOMY  1970   gallstone removal     KNEE ARTHROSCOPY  2002   right knee   KNEE ARTHROTOMY Left 12/09/2014   Procedure: LEFT KNEE ARTHROTOMY WITH SCAR EXCISION;   Surgeon: Loanne Drilling, MD;  Location: [facility];  Service: Orthopedics;  Laterality: Left;   KNEE CLOSED REDUCTION Left 05/12/2015   Procedure: LEFT KNEE CLOSED MANIPULATION ;  Surgeon: Ollen Gross, MD;  Location: [facility];  Service: Orthopedics;  Laterality: Left;   LEFT HEART CATHETERIZATION WITH CORONARY ANGIOGRAM N/A 11/13/2014   Procedure: LEFT HEART CATHETERIZATION WITH CORONARY ANGIOGRAM;  Surgeon: Corky Crafts, MD;  Location: [facility] C[facility] LAB;  Service: Cardiovascular;  Laterality: N/A;   TOTAL KNEE ARTHROPLASTY Right 06/04/2017   Procedure: RIGHT TOTAL KNEE ARTHROPLASTY;  Surgeon: Ollen Gross, MD;  Location: [facility];  Service: Orthopedics;  Laterality: Right;  Adductor Block   TUBAL LIGATION      Prior to Admission medications   Medication Sig Start Date End Date Taking? Authorizing Provider  meloxicam (MOBIC) 15 MG tablet Take 15 mg by mouth daily.   Yes [provider]  omeprazole (PRILOSEC) 40 MG capsule Take 40 mg by mouth daily as needed. For acid reflux. 04/16/17   [provider]    Current Outpatient Medications  Medication Sig Dispense Refill   meloxicam (MOBIC) 15 MG tablet Take 15 mg by mouth daily.     omeprazole (PRILOSEC) 40 MG capsule Take 40 mg by mouth daily as needed. For acid reflux.     Current Facility-Administered Medications  Medication Dose Route Frequency Provider Last Rate Last Admin   0.9 %  sodium chloride infusion  500 mL Intravenous Continuous Yoshimi Sarr V, DO  Allergies as of 03/12/2024 - Review Complete 03/12/2024  Allergen Reaction Noted   Penicillins Hives and Swelling 01/25/2012   Nabumetone Other (See Comments) 03/04/2014    Family History  Problem Relation Age of Onset   Heart disease Mother 32       MI   Cancer Mother 28       breast ca   Breast cancer Mother    Deep vein thrombosis Father    Heart disease Sister 35       MI   Liver disease Child    Colon cancer Neg Hx    Esophageal cancer Neg Hx     Stomach cancer Neg Hx    Rectal cancer Neg Hx     Social History   Socioeconomic History   Marital status: Married    Spouse name: Not on file   Number of children: 2   Years of education: Not on file   Highest education level: Not on file  Occupational History   Occupation: Housekeeping    Employer: [employer]  Tobacco Use   Smoking status: Never   Smokeless tobacco: Never  Vaping Use   Vaping status: Never Used  Substance and Sexual Activity   Alcohol use: No   Drug use: No   Sexual activity: Not Currently    Partners: Male  Other Topics Concern   Not on file  Social History Narrative   Lives with husband.  2 children- grown.  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   Social Drivers of Corporate investment banker Strain: Not on [employer] Insecurity: Not on file  Transportation Needs: Not on file  Physical Activity: Not on file  Stress: Not on file  Social Connections: Unknown (05/08/2022)   Received from [facility]   Social Network    Social Network: Not on file  Intimate Partner Violence: Unknown (03/29/2022)   Received from [facility], [facility]   HITS    Physically Hurt: Not on file    Insult or Talk Down To: Not on file    Threaten Physical Harm: Not on file    Scream or Curse: Not on file    Physical Exam: Vital signs in last 24 hours: @BP  (!) 150/100   Pulse 92   Temp 98.7 F (37.1 C)   Resp 20   Ht 5' (1.524 m)   Wt 136 lb (61.7 kg)   SpO2 100%   BMI 26.56 kg/m  GEN: NAD EYE: Sclerae anicteric ENT: MMM CV: Non-tachycardic Pulm: CTA b/l GI: Soft, NT/ND NEURO:  Alert & Oriented x 3   Doristine Locks, DO [facility]   03/12/2024 1:39 PM

## 2024-03-12 NOTE — Progress Notes (Signed)
 Called to room to assist during endoscopic procedure.  Patient ID and intended procedure confirmed with present staff. Received instructions for my participation in the procedure from the performing physician.

## 2024-03-12 NOTE — Progress Notes (Unsigned)
 Vss nad trans to pacu

## 2024-03-12 NOTE — Patient Instructions (Signed)

## 2024-03-13 ENCOUNTER — Telehealth: Payer: Self-pay

## 2024-03-13 NOTE — Telephone Encounter (Signed)
  Follow up Call-     03/12/2024   12:36 PM  Call back number  Post procedure Call Back phone  # [phone]  Permission to leave phone message Yes     Patient questions:  Do you have a fever, pain , or abdominal swelling? No. Pain Score  0 *  Have you tolerated food without any problems? Yes.    Have you been able to return to your normal activities? Yes.    Do you have any questions about your discharge instructions: Diet   No. Medications  No. Follow up visit  No.  Do you have questions or concerns about your Care? Yes.   Had a small amt of "jelly like blood" come out this morning, not more than once, no bleeding, pt to call if increased amount or if excessive bleeding or clots coming out today.  Actions: * If pain score is 4 or above: No action needed, pain <4.

## 2024-03-17 LAB — SURGICAL PATHOLOGY

## 2024-03-26 ENCOUNTER — Encounter: Payer: Self-pay | Admitting: Gastroenterology

## 2024-07-03 ENCOUNTER — Encounter: Payer: Self-pay | Admitting: Gastroenterology

## 2024-07-03 ENCOUNTER — Ambulatory Visit: Admitting: Gastroenterology

## 2024-07-03 VITALS — BP 158/82 | HR 77 | Ht 60.0 in | Wt 142.2 lb

## 2024-07-03 DIAGNOSIS — R1013 Epigastric pain: Secondary | ICD-10-CM | POA: Diagnosis not present

## 2024-07-03 DIAGNOSIS — R143 Flatulence: Secondary | ICD-10-CM | POA: Diagnosis not present

## 2024-07-03 DIAGNOSIS — R110 Nausea: Secondary | ICD-10-CM

## 2024-07-03 DIAGNOSIS — R140 Abdominal distension (gaseous): Secondary | ICD-10-CM

## 2024-07-03 DIAGNOSIS — K219 Gastro-esophageal reflux disease without esophagitis: Secondary | ICD-10-CM | POA: Diagnosis not present

## 2024-07-03 DIAGNOSIS — G8929 Other chronic pain: Secondary | ICD-10-CM

## 2024-07-03 NOTE — Progress Notes (Signed)
 Chief Complaint: follow-up, stomach ache, discuss EGD Primary GI Doctor:Dr. Cirigiliano  HPI:  Samantha Benton is a 68 y.o. female with a history of GERD, anxiety, cholecystectomy, referred to the Gastroenterology Clinic for evaluation of colonoscopy for ongoing CRC screening.   Previously followed at [facility].  Was last seen in that office on 12/30/2020 for evaluation of 1-2 months of abdominal pain.  Was on MiraLAX  and Bentyl along with omeprazole  prn reflux symptoms.  Plan was for CT abdomen/pelvis and EGD.  Based on records, does not appear that those studies were done (perhaps symptoms resolved?) at that time.    - Reports having colonoscopy x2 prior to 2017 which were notable for polyps per patient. No reports available for review.  - 09/15/2016: Colonoscopy: Negative exam.  Placed on 5-year recall because of polyp history  - 11/15/2021: Colonoscopy: Limited report available for review demonstrates incomplete colon to ascending colon and recommended air-contrast barium enema  - 12/09/2021: Double contrast barium enema: No constricting lesions or polyps.  Normal haustral pattern, mucosal surfaces are smooth  -03/12/24 colonoscopy : - One 5 mm polyp in the proximal transverse colon, removed with a cold snare. Resected and retrieved. - There was significant looping of the colon. - Non- bleeding internal hemorrhoids.- The examined portion of the ileum was normal. Path: 1 TA, Recall 7 years  Reviewed most recent labs were 10/2023: Normal CBC, BMP. No other recent abdominal imaging for review.   Last seen by Dr. Jerie in GI office on 03/06/24 to scheduled CRC screening.  Interval History     Patient presents with main complaint upper abdominal discomfort, bloating, flatulence, with nausea. She reports she has had this issue for over 2 years. She eats dinner at 6 pm, she reports about 8:30-9 pm she starts to have upper abdominal discomfort that radiates across. She reports it does not  matter what she eats. She reports it will last few hours then resolves on own by the time she lays down. She reports she has tried OTC Rolaids, Pepto, or Tums and feels this makes her symptoms worse.    Patient reports she has a bowel movement in the morning most days after she drinks something warm such as coffee or hot tea. She is not sure she empties out. She reports she has tried OTC Miralax  in past for current issue and did not help.   Patient has history of GERD and takes omeprazole  40 mg po daily prn.  She reports the PPI does not resolve the upper abdominal discomfort. Patient takes Meloxicam po daily and reports she has been taking for years.  She does not take any over-the-counter NSAIDs. Appetite good.   Patient has never had EGD.  Patient reports she had imaging of her abdomen last year but with looking at records all I saw was a lumbar MRI.  She was under the impression that they were looking at her abdominal pain and not her back?  Wt Readings from Last 3 Encounters:  07/03/24 142 lb 4 oz (64.5 kg)  03/12/24 136 lb (61.7 kg)  03/06/24 136 lb (61.7 kg)     Past Medical History:  Diagnosis Date   Anemia    hx. of   Anxiety    pt. denies at preop appt.   Arthritis    right knee   Colon polyp    Family history of adverse reaction to anesthesia    daughter woke up during surgery   Gallstone    GERD (  gastroesophageal reflux disease)    PONV (postoperative nausea and vomiting)     Past Surgical History:  Procedure Laterality Date   CARPAL TUNNEL RELEASE  2008   CHOLECYSTECTOMY  1970   gallstone removal     KNEE ARTHROSCOPY  2002   right knee   KNEE ARTHROTOMY Left 12/09/2014   Procedure: LEFT KNEE ARTHROTOMY WITH SCAR EXCISION;  Surgeon: Dempsey Melodi GAILS, MD;  Location: [facility];  Service: Orthopedics;  Laterality: Left;   KNEE CLOSED REDUCTION Left 05/12/2015   Procedure: LEFT KNEE CLOSED MANIPULATION ;  Surgeon: Dempsey Melodi, MD;  Location: [facility];  Service: Orthopedics;   Laterality: Left;   LEFT HEART CATHETERIZATION WITH CORONARY ANGIOGRAM N/A 11/13/2014   Procedure: LEFT HEART CATHETERIZATION WITH CORONARY ANGIOGRAM;  Surgeon: Candyce GORMAN Reek, MD;  Location: [facility] CATH LAB;  Service: Cardiovascular;  Laterality: N/A;   TOTAL KNEE ARTHROPLASTY Right 06/04/2017   Procedure: RIGHT TOTAL KNEE ARTHROPLASTY;  Surgeon: Melodi Dempsey, MD;  Location: [facility];  Service: Orthopedics;  Laterality: Right;  Adductor Block   TUBAL LIGATION      Current Outpatient Medications  Medication Sig Dispense Refill   meloxicam (MOBIC) 15 MG tablet Take 15 mg by mouth daily.     omeprazole  (PRILOSEC) 40 MG capsule Take 40 mg by mouth daily as needed. For acid reflux.     No current facility-administered medications for this visit.    Allergies as of 07/03/2024 - Review Complete 07/03/2024  Allergen Reaction Noted   Penicillins Hives and Swelling 01/25/2012   Nabumetone Other (See Comments) 03/04/2014    Family History  Problem Relation Age of Onset   Heart disease Mother 9       MI   Cancer Mother 82       breast ca   Breast cancer Mother    Deep vein thrombosis Father    Heart disease Sister 10       MI   Liver disease Child    Colon cancer Neg Hx    Esophageal cancer Neg Hx    Stomach cancer Neg Hx    Rectal cancer Neg Hx     Review of Systems:    Constitutional: No weight loss, fever, chills, weakness or fatigue HEENT: Eyes: No change in vision               Ears, Nose, Throat:  No change in hearing or congestion Skin: No rash or itching Cardiovascular: No chest pain, chest pressure or palpitations   Respiratory: No SOB or cough Gastrointestinal: See HPI and otherwise negative Genitourinary: No dysuria or change in urinary frequency Neurological: No headache, dizziness or syncope Musculoskeletal: No new muscle or joint pain Hematologic: No bleeding or bruising Psychiatric: No history of depression or anxiety    Physical Exam:  Vital signs: BP (!)  158/82   Pulse 77   Ht 5' (1.524 m)   Wt 142 lb 4 oz (64.5 kg)   BMI 27.78 kg/m   Constitutional:   Pleasant  female appears to be in NAD, Well developed, Well nourished, alert and cooperative Throat: Oral cavity and pharynx without inflammation, swelling or lesion.  Respiratory: Respirations even and unlabored. Lungs clear to auscultation bilaterally.   No wheezes, crackles, or rhonchi.  Cardiovascular: Normal S1, S2. Regular rate and rhythm. No peripheral edema, cyanosis or pallor.  Gastrointestinal:  Soft, nondistended, upper abdominal tenderness with palpation. No rebound or guarding. Normal bowel sounds. No appreciable masses or hepatomegaly. Rectal:  Not performed.  Msk:  Symmetrical without gross deformities. Without edema, no deformity or joint abnormality.  Neurologic:  Alert and  oriented x4;  grossly normal neurologically.  Skin:   Dry and intact without significant lesions or rashes. Psychiatric: Oriented to person, place and time. Demonstrates good judgement and reason without abnormal affect or behaviors.  RELEVANT LABS AND IMAGING: CBC    Latest Ref Rng & Units 11/01/2023   11:34 AM 04/27/2022    9:40 AM 06/06/2017    4:57 AM  CBC  WBC 4.0 - 10.5 K/uL 8.0  6.4  12.6   Hemoglobin 12.0 - 15.0 g/dL 87.4  87.7  9.5   Hematocrit 36.0 - 46.0 % 40.0  38.1  29.6   Platelets 150 - 400 K/uL 275  259  236      CMP     Latest Ref Rng & Units 11/01/2023   11:34 AM 11/21/2020    9:02 AM 06/06/2017    4:57 AM  CMP  Glucose 70 - 99 mg/dL 896  95  94   BUN 8 - 23 mg/dL 12  13  14    Creatinine 0.44 - 1.00 mg/dL 9.53  9.39  9.48   Sodium 135 - 145 mmol/L 140  141  141   Potassium 3.5 - 5.1 mmol/L 3.7  4.1  3.5   Chloride 98 - 111 mmol/L 104  103  105   CO2 22 - 32 mmol/L 24  27  28    Calcium 8.9 - 10.3 mg/dL 9.7  9.4  8.9   Total Protein 6.0 - 8.5 g/dL  6.4    Total Bilirubin 0.0 - 1.2 mg/dL  0.3    Alkaline Phos 44 - 121 IU/L  101    AST 0 - 40 IU/L  10    ALT 0 - 32 IU/L  9        Lab Results  Component Value Date   TSH 0.482 01/25/2012     Assessment: Encounter Diagnoses  Name Primary?   Gastroesophageal reflux disease, unspecified whether esophagitis present Yes   Bloating    Flatulence    Abdominal pain, chronic, epigastric    Nausea without vomiting       68 year old female patient with chronic upper abdominal pain with bloating and nausea.  Patient reports her symptoms are worse in the evening after dinner.  Patient states it does not matter what type of food she eats.  Patient has not relieved with PPI therapy.  Patient does report history of constipation and has used over-the-counter MiraLAX  without any improvement in current symptoms.  Patient did recently have colonoscopy that showed significant looping.  Patient is very anxious and concerned that she Samantha Benton have stomach cancer, we will go ahead and proceed with upper GI endoscopy with small bowel biopsy.  If negative exam I did discuss with patient doing further imaging and maximizing her bowel regimen routine.  Patient verbalizes understanding.  Plan: -Continue omeprazole  40 mg po daily -Reinforced GERD diet, no late meals 3 to 4 hours before lying down - Schedule EGD in [facility] with Dr. San. The risks and benefits of EGD with possible biopsies and esophageal dilation were discussed with the patient who agrees to proceed. - If negative workup consider imaging and/or SIBO testing  Thank you for the courtesy of this consult. Please call me with any questions or concerns.   Samantha Shore, FNP-C [facility] 07/03/2024, 10:26 AM  Cc: Vernon Velna SAUNDERS, MD

## 2024-07-03 NOTE — Patient Instructions (Addendum)
 GERD Recommend GERD diet, no late meals 3-4 hours before lying down Omeprazole  40 mg po daily  Recommend high fiber diet Walk 10-15 minutes after dinner  You have been scheduled for an endoscopy and colonoscopy. Please follow the written instructions given to you at your visit today.  If you use inhalers (even only as needed), please bring them with you on the day of your procedure.  DO NOT TAKE 7 DAYS PRIOR TO TEST- Trulicity (dulaglutide) Ozempic, Wegovy (semaglutide) Mounjaro (tirzepatide) Bydureon Bcise (exanatide extended release)  DO NOT TAKE 1 DAY PRIOR TO YOUR TEST Rybelsus (semaglutide) Adlyxin (lixisenatide) Victoza (liraglutide) Byetta (exanatide) ___________________________________________________________________________  Due to recent changes in healthcare laws, you may see the results of your imaging and laboratory studies on MyChart before your provider has had a chance to review them.  We understand that in some cases there may be results that are confusing or concerning to you. Not all laboratory results come back in the same time frame and the provider may be waiting for multiple results in order to interpret others.  Please give us  48 hours in order for your provider to thoroughly review all the results before contacting the office for clarification of your results.   _______________________________________________________  If your blood pressure at your visit was 140/90 or greater, please contact your primary care physician to follow up on this.  _______________________________________________________  If you are age 25 or older, your body mass index should be between 23-30. Your Body mass index is 27.78 kg/m. If this is out of the aforementioned range listed, please consider follow up with your Primary Care Provider.  If you are age 37 or younger, your body mass index should be between 19-25. Your Body mass index is 27.78 kg/m. If this is out of the  aformentioned range listed, please consider follow up with your Primary Care Provider.   ________________________________________________________  The [facility] providers would like to encourage you to use MYCHART to communicate with providers for non-urgent requests or questions.  Due to long hold times on the telephone, sending your provider a message by [facility] may be a faster and more efficient way to get a response.  Please allow 48 business hours for a response.  Please remember that this is for non-urgent requests.  _______________________________________________________   Thank you for trusting me with your gastrointestinal care. [name], RNP

## 2024-07-09 NOTE — Progress Notes (Signed)
 Agree with the assessment and plan as outlined by [facility], FNP-C.  Samantha Bottino, DO, [facility]

## 2024-07-10 ENCOUNTER — Encounter: Payer: Self-pay | Admitting: Gastroenterology

## 2024-07-10 ENCOUNTER — Ambulatory Visit: Admitting: Gastroenterology

## 2024-07-10 VITALS — BP 111/66 | HR 73 | Temp 97.7°F | Resp 19 | Ht 60.0 in | Wt 142.0 lb

## 2024-07-10 DIAGNOSIS — R110 Nausea: Secondary | ICD-10-CM

## 2024-07-10 DIAGNOSIS — K259 Gastric ulcer, unspecified as acute or chronic, without hemorrhage or perforation: Secondary | ICD-10-CM

## 2024-07-10 DIAGNOSIS — K2970 Gastritis, unspecified, without bleeding: Secondary | ICD-10-CM

## 2024-07-10 DIAGNOSIS — K2950 Unspecified chronic gastritis without bleeding: Secondary | ICD-10-CM | POA: Diagnosis not present

## 2024-07-10 DIAGNOSIS — R140 Abdominal distension (gaseous): Secondary | ICD-10-CM

## 2024-07-10 DIAGNOSIS — R1013 Epigastric pain: Secondary | ICD-10-CM

## 2024-07-10 DIAGNOSIS — K2100 Gastro-esophageal reflux disease with esophagitis, without bleeding: Secondary | ICD-10-CM

## 2024-07-10 MED ORDER — OMEPRAZOLE 40 MG PO CPDR: 40.0000 mg | DELAYED_RELEASE_CAPSULE | Freq: Two times a day (BID) | ORAL | 3 refills | Status: DC

## 2024-07-10 MED ORDER — SODIUM CHLORIDE 0.9 % IV SOLN: 500.0000 mL | Freq: Once | INTRAVENOUS | Status: DC

## 2024-07-10 NOTE — Op Note (Signed)
 [facility] Patient Name: Samantha Benton Procedure Date: 07/10/2024 10:56 AM MRN: [id number] Endoscopist: Sandor Flatter , MD, [id number] Age: 68 Referring MD:  Date of Birth: [birth year] Gender: Female Account #: [id number] Procedure:                Upper GI endoscopy Indications:              Upper abdominal pain, Suspected esophageal reflux,                            Abdominal bloating, Nausea Medicines:                Monitored Anesthesia Care Procedure:                Pre-Anesthesia Assessment:                           - Prior to the procedure, a History and Physical                            was performed, and patient medications and                            allergies were reviewed. The patient's tolerance of                            previous anesthesia was also reviewed. The risks                            and benefits of the procedure and the sedation                            options and risks were discussed with the patient.                            All questions were answered, and informed consent                            was obtained. Prior Anticoagulants: The patient has                            taken no anticoagulant or antiplatelet agents. ASA                            Grade Assessment: II - A patient with mild systemic                            disease. After reviewing the risks and benefits,                            the patient was deemed in satisfactory condition to                            undergo the procedure.  After obtaining informed consent, the endoscope was                            passed under direct vision. Throughout the                            procedure, the patient's blood pressure, pulse, and                            oxygen saturations were monitored continuously. The                            GIF [id number] #[id number] was introduced through the                            mouth, and advanced to  the second part of duodenum.                            The upper GI endoscopy was accomplished without                            difficulty. The patient tolerated the procedure                            well. Scope In: Scope Out: Findings:                 LA Grade A (one or more mucosal breaks less than 5                            mm, not extending between tops of 2 mucosal folds)                            esophagitis with no bleeding was found 31 cm from                            the incisors.                           One non-bleeding cratered gastric ulcer with no                            stigmata of bleeding was found at the incisura. The                            lesion was 5 mm in largest dimension. Biopsies were                            taken with a cold forceps for histology. Estimated                            blood loss was minimal.  Scattered minimal inflammation characterized by                            erythema was found in the gastric body and in the                            gastric antrum. Biopsies were taken with a cold                            forceps for Helicobacter pylori testing. Estimated                            blood loss was minimal.                           The examined duodenum was normal. Complications:            No immediate complications. Estimated Blood Loss:     Estimated blood loss was minimal. Impression:               - LA Grade A reflux esophagitis with no bleeding.                           - Non-bleeding gastric ulcer with no stigmata of                            bleeding. Biopsied.                           - Gastritis. Biopsied.                           - Normal examined duodenum. Recommendation:           - Patient has a contact number available for                            emergencies. The signs and symptoms of potential                            delayed complications were discussed with the                             patient. Return to normal activities tomorrow.                            Written discharge instructions were provided to the                            patient.                           - Resume previous diet.                           - Await pathology results.                           -  Start Prilosec (omeprazole ) 40 mg PO BID for 6                            weeks to promote mucosal healing of both the                            gastric ulcer and the reflux esophagitis. After 6                            weeks, reduce to 40 mg daily for continued control                            of reflux. If reflux well-controlled at daily                            dosing, can potentially titrate to lower effective                            dose.                           - Return to GI clinic in 3 months, or sooner as                            needed. Sandor Flatter, MD 07/10/2024 11:23:44 AM

## 2024-07-10 NOTE — Progress Notes (Signed)
 GASTROENTEROLOGY PROCEDURE H&P NOTE   Primary Care Physician: Vernon Velna SAUNDERS, MD    Reason for Procedure:  GERD, abdominal bloating, nausea, upper abdominal pain  Plan:    EGD  Patient is appropriate for endoscopic procedure(s) in the ambulatory ([facility]) setting.  The nature of the procedure, as well as the risks, benefits, and alternatives were carefully and thoroughly reviewed with the patient. Ample time for discussion and questions allowed. The patient understood, was satisfied, and agreed to proceed.     HPI: Samantha Benton is a 68 y.o. female who presents for EGD for evaluation of postprandial upper abdominal pain, bloating, nausea.  Additionally, patient with a history of GERD.  Patient was most recently seen in the Gastroenterology Clinic on 07/03/2024.  No interval change in medical history since that appointment. Please refer to that note for full details regarding GI history and clinical presentation.   Past Medical History:  Diagnosis Date   Anemia    hx. of   Anxiety    pt. denies at preop appt.   Arthritis    right knee   Colon polyp    Family history of adverse reaction to anesthesia    daughter woke up during surgery   Gallstone    GERD (gastroesophageal reflux disease)    PONV (postoperative nausea and vomiting)     Past Surgical History:  Procedure Laterality Date   CARPAL TUNNEL RELEASE  2008   CHOLECYSTECTOMY  1970   gallstone removal     KNEE ARTHROSCOPY  2002   right knee   KNEE ARTHROTOMY Left 12/09/2014   Procedure: LEFT KNEE ARTHROTOMY WITH SCAR EXCISION;  Surgeon: Dempsey Melodi GAILS, MD;  Location: [facility];  Service: Orthopedics;  Laterality: Left;   KNEE CLOSED REDUCTION Left 05/12/2015   Procedure: LEFT KNEE CLOSED MANIPULATION ;  Surgeon: Dempsey Melodi, MD;  Location: [facility];  Service: Orthopedics;  Laterality: Left;   LEFT HEART CATHETERIZATION WITH CORONARY ANGIOGRAM N/A 11/13/2014   Procedure: LEFT HEART CATHETERIZATION WITH CORONARY  ANGIOGRAM;  Surgeon: Candyce GORMAN Reek, MD;  Location: [facility] C[facility] LAB;  Service: Cardiovascular;  Laterality: N/A;   TOTAL KNEE ARTHROPLASTY Right 06/04/2017   Procedure: RIGHT TOTAL KNEE ARTHROPLASTY;  Surgeon: Melodi Dempsey, MD;  Location: [facility];  Service: Orthopedics;  Laterality: Right;  Adductor Block   TUBAL LIGATION      Prior to Admission medications   Medication Sig Start Date End Date Taking? Authorizing Provider  meloxicam (MOBIC) 15 MG tablet Take 15 mg by mouth daily.    [provider]  omeprazole  (PRILOSEC) 40 MG capsule Take 40 mg by mouth daily as needed. For acid reflux. 04/16/17   [provider]    Current Outpatient Medications  Medication Sig Dispense Refill   meloxicam (MOBIC) 15 MG tablet Take 15 mg by mouth daily.     omeprazole  (PRILOSEC) 40 MG capsule Take 40 mg by mouth daily as needed. For acid reflux.     Current Facility-Administered Medications  Medication Dose Route Frequency Provider Last Rate Last Admin   0.9 %  sodium chloride  infusion  500 mL Intravenous Once Dequavious Harshberger V, DO        Allergies as of 07/10/2024 - Review Complete 07/10/2024  Allergen Reaction Noted   Penicillins Hives and Swelling 01/25/2012   Nabumetone Other (See Comments) 03/04/2014    Family History  Problem Relation Age of Onset   Heart disease Mother 36       MI  Cancer Mother 47       breast ca   Breast cancer Mother    Deep vein thrombosis Father    Heart disease Sister 19       MI   Liver disease Child    Colon cancer Neg Hx    Esophageal cancer Neg Hx    Stomach cancer Neg Hx    Rectal cancer Neg Hx     Social History   Socioeconomic History   Marital status: Married    Spouse name: Not on file   Number of children: 2   Years of education: Not on file   Highest education level: Not on file  Occupational History   Occupation: Housekeeping    Employer: [employer]  Tobacco Use   Smoking status: Never   Smokeless tobacco:  Never  Vaping Use   Vaping status: Never Used  Substance and Sexual Activity   Alcohol use: No   Drug use: No   Sexual activity: Not Currently    Partners: Male  Other Topics Concern   Not on file  Social History Narrative   Lives with husband.  2 children- grown.  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   Social Drivers of Corporate investment banker Strain: Not on [employer] Insecurity: Not on file  Transportation Needs: Not on file  Physical Activity: Not on file  Stress: Not on file  Social Connections: Unknown (05/08/2022)   Received from [facility]   Social Network    Social Network: Not on file  Intimate Partner Violence: Unknown (03/29/2022)   Received from [facility]   HITS    Physically Hurt: Not on file    Insult or Talk Down To: Not on file    Threaten Physical Harm: Not on file    Scream or Curse: Not on file    Physical Exam: Vital signs in last 24 hours: @BP  (!) 152/86   Pulse 80   Temp 97.7 F (36.5 C) (Temporal)   Ht 5' (1.524 m)   Wt 142 lb (64.4 kg)   SpO2 100%   BMI 27.73 kg/m  GEN: NAD EYE: Sclerae anicteric ENT: MMM CV: Non-tachycardic Pulm: CTA b/l GI: Soft, NT/ND NEURO:  Alert & Oriented x 3   Sandor Flatter, DO [facility]   07/10/2024 10:55 AM

## 2024-07-10 NOTE — Patient Instructions (Signed)
 Please read handouts provided. Continue present medications. Await pathology results. Resume previous diet. Start Prilosec ( omeprazole  ) 40 mg twice daily for 6 weeks, then reduce to 40 mg daily for continued control of reflux. Can titrate to lower effective dose. Return to GI clinic in 3 months, or sooner as needed.   YOU HAD AN ENDOSCOPIC PROCEDURE TODAY AT THE [facility]:   Refer to the procedure report that was given to you for any specific questions about what was found during the examination.  If the procedure report does not answer your questions, please call your gastroenterologist to clarify.  If you requested that your care partner not be given the details of your procedure findings, then the procedure report has been included in a sealed envelope for you to review at your convenience later.  YOU SHOULD EXPECT: Some feelings of bloating in the abdomen. Passage of more gas than usual.  Walking can help get rid of the air that was put into your GI tract during the procedure and reduce the bloating. If you had a lower endoscopy (such as a colonoscopy or flexible sigmoidoscopy) you may notice spotting of blood in your stool or on the toilet paper. If you underwent a bowel prep for your procedure, you may not have a normal bowel movement for a few days.  Please Note:  You might notice some irritation and congestion in your nose or some drainage.  This is from the oxygen used during your procedure.  There is no need for concern and it should clear up in a day or so.  SYMPTOMS TO REPORT IMMEDIATELY:  Following upper endoscopy (EGD)  Vomiting of blood or coffee ground material  New chest pain or pain under the shoulder blades  Painful or persistently difficult swallowing  New shortness of breath  Fever of 100F or higher  Black, tarry-looking stools  For urgent or emergent issues, a gastroenterologist can be reached at any hour by calling [phone]. Do not use MyChart  messaging for urgent concerns.    DIET:  We do recommend a small meal at first, but then you may proceed to your regular diet.  Drink plenty of fluids but you should avoid alcoholic beverages for 24 hours.  ACTIVITY:  You should plan to take it easy for the rest of today and you should NOT DRIVE or use heavy machinery until tomorrow (because of the sedation medicines used during the test).    FOLLOW UP: Our staff will call the number listed on your records the next business day following your procedure.  We will call around 7:15- 8:00 am to check on you and address any questions or concerns that you may have regarding the information given to you following your procedure. If we do not reach you, we will leave a message.     If any biopsies were taken you will be contacted by phone or by letter within the next 1-3 weeks.  Please call us  at [phone] if you have not heard about the biopsies in 3 weeks.    SIGNATURES/CONFIDENTIALITY: You and/or your care partner have signed paperwork which will be entered into your electronic medical record.  These signatures attest to the fact that that the information above on your After Visit Summary has been reviewed and is understood.  Full responsibility of the confidentiality of this discharge information lies with you and/or your care-partner.

## 2024-07-10 NOTE — Progress Notes (Signed)
 Report given to PACU, vss

## 2024-07-10 NOTE — Progress Notes (Signed)
 Called to room to assist during endoscopic procedure.  Patient ID and intended procedure confirmed with present staff. Received instructions for my participation in the procedure from the performing physician.

## 2024-07-10 NOTE — Progress Notes (Signed)
 Pt's states no medical or surgical changes since previsit or office visit.

## 2024-07-10 NOTE — Progress Notes (Signed)
 Patient will call and schedule follow-up appointment at GI clinic after checking her schedule. B.Danylah Holden RN.

## 2024-07-11 ENCOUNTER — Telehealth: Payer: Self-pay | Admitting: *Deleted

## 2024-07-11 NOTE — Telephone Encounter (Signed)
  Follow up Call-     07/10/2024   10:27 AM 03/12/2024   12:36 PM  Call back number  Post procedure Call Back phone  # [phone]  Permission to leave phone message Yes Yes     Patient questions:  Do you have a fever, pain , or abdominal swelling? No. Pain Score  0 *  Have you tolerated food without any problems? Yes.    Have you been able to return to your normal activities? Yes.    Do you have any questions about your discharge instructions: Diet   No. Medications  No. Follow up visit  No.  Do you have questions or concerns about your Care? No.  Actions: * If pain score is 4 or above: No action needed, pain <4.

## 2024-07-15 LAB — SURGICAL PATHOLOGY

## 2024-07-28 ENCOUNTER — Ambulatory Visit: Payer: Self-pay | Admitting: Gastroenterology

## 2024-10-14 ENCOUNTER — Ambulatory Visit: Admitting: Gastroenterology

## 2024-10-14 ENCOUNTER — Encounter: Payer: Self-pay | Admitting: Gastroenterology

## 2024-10-14 VITALS — BP 138/82 | HR 83 | Ht 60.0 in | Wt 147.5 lb

## 2024-10-14 DIAGNOSIS — K2100 Gastro-esophageal reflux disease with esophagitis, without bleeding: Secondary | ICD-10-CM | POA: Diagnosis not present

## 2024-10-14 DIAGNOSIS — K259 Gastric ulcer, unspecified as acute or chronic, without hemorrhage or perforation: Secondary | ICD-10-CM | POA: Diagnosis not present

## 2024-10-14 DIAGNOSIS — Z860100 Personal history of colon polyps, unspecified: Secondary | ICD-10-CM | POA: Diagnosis not present

## 2024-10-14 MED ORDER — OMEPRAZOLE 40 MG PO CPDR
40.0000 mg | DELAYED_RELEASE_CAPSULE | Freq: Every day | ORAL | 5 refills | Status: AC
Start: 2024-10-14 — End: ?

## 2024-10-14 NOTE — Progress Notes (Signed)
 Chief Complaint:    GERD, gastric ulcer  GI History: Samantha Benton is a 68 y.o. female with a history of GERD, anxiety, cholecystectomy, who follows in the GI clinic for the following:   Previously followed at [facility].  Was last seen in that office on 12/30/2020 for evaluation of 1-2 months of abdominal pain.  Was on MiraLAX  and Bentyl along with omeprazole  prn reflux symptoms.  Plan was for CT abdomen/pelvis and EGD.  Based on records, does not appear that those studies were done (perhaps symptoms resolved?) at that time.    - Reports having colonoscopy x2 prior to 2017 which were notable for polyps per patient. No reports available for review.  - 09/15/2016: Colonoscopy: Negative exam.  Placed on 5-year recall because of polyp history  - 11/15/2021: Colonoscopy: Limited report available for review demonstrates incomplete colon to ascending colon and recommended air-contrast barium enema  - 12/09/2021: Double contrast barium enema: No constricting lesions or polyps.  Normal haustral pattern, mucosal surfaces are smooth  - 03/12/24 colonoscopy : One 5 mm polyp in the proximal transverse colon, removed with a cold snare (adenoma). There was significant looping of the colon, non- bleeding internal hemorrhoids, normal TI. Recall 7 years - 07/10/2024: EGD: LA Grade A erosive esophagitis, 5 mm cratered gastric ulcer in incisura (path benign), minimal gastritis in the body/antrum (path benign), normal duodenum.  Increased Prilosec to 40 mg twice daily x 6 weeks then reduced back to daily dosing for reflux control  Has had various GI symptoms to include upper abdominal discomfort, bloating, flatulence, nausea since at least 2023.  Symptoms tend to start about 2-3 hours after eating dinner and typically lasts for a few hours.  Independent of food types.  No change with trial of OTC Rolaids, Pepto-Bismol, Tums.  No change in your GI symptoms with trial of daily PPI.  Does use meloxicam daily for many  years.  EGD in 06/2024 with 5 mm cratered gastric ulcer and treated with high-dose PPI therapy.  History of reflux symptoms (dyspepsia) which typically responds to omeprazole  40 mg on demand.  History of constipation with straining to have BM at times.  No hematochezia.  HPI:     Patient is a 68 y.o. female presenting to the Gastroenterology Clinic for follow-up.  Was last seen by [name] on 07/03/2024.  Main issue at that time was upper abdominal discomfort, bloating, flatulence, nausea.  Recommended taking her omeprazole  40 mg daily (instead of on-demand), discussed GERD diet/lifestyle modifications, and scheduled for EGD as above (LA Grade A erosive esophagitis, 5 mm gastric incisura ulcer, mild gastritis).  She was treated with Prilosec 40 mg twice daily x 6 weeks. Reflux sxs have completely resolved and has since decreased to 40 mg daily and sxs still well controlled. Dyspepsia has resolved. Tolerating all PO intake without issue. No lower GI sxs.   Has reduced Mobic to every other day.   Needs refill on Prilosec.   Otherwise, no new labs or abdominal imaging for review today.  Review of systems:     No chest pain, no SOB, no fevers, no urinary sx   Past Medical History:  Diagnosis Date   Anemia    hx. of   Anxiety    pt. denies at preop appt.   Arthritis    right knee   Colon polyp    Family history of adverse reaction to anesthesia    daughter woke up during surgery   Gallstone  GERD (gastroesophageal reflux disease)    PONV (postoperative nausea and vomiting)     Patient's surgical history, family medical history, social history, medications and allergies were all reviewed in Epic    Current Outpatient Medications  Medication Sig Dispense Refill   meloxicam (MOBIC) 15 MG tablet Take 15 mg by mouth daily.     omeprazole  (PRILOSEC) 40 MG capsule Take 1 capsule (40 mg total) by mouth in the morning and at bedtime. Prilosec 40 mg twice daily for 6 weeks, then reduce  to 40 mg daily for continue control of reflux.  Can titrate to lower effective dose. 90 capsule 3   No current facility-administered medications for this visit.    Physical Exam:     BP 138/82 (BP Location: Left Arm, Patient Position: Sitting, Cuff Size: Normal)   Pulse 83   Ht 5' (1.524 m)   Wt 147 lb 8 oz (66.9 kg)   BMI 28.81 kg/m   GENERAL:  Pleasant female in NAD PSYCH: : Cooperative, normal affect EENT:  conjunctiva pink, mucous membranes moist, neck supple without masses CARDIAC:  RRR, no murmur heard, no peripheral edema PULM: Normal respiratory effort, lungs CTA bilaterally, no wheezing ABDOMEN:  Nondistended, soft, nontender. No obvious masses, no hepatomegaly,  normal bowel sounds SKIN:  turgor, no lesions seen Musculoskeletal:  Normal muscle tone, normal strength NEURO: Alert and oriented x 3, no focal neurologic deficits   IMPRESSION and PLAN:    1) GERD with erosive esophagitis Good response to trial of high-dose PPI therapy with complete resolution of reflux symptoms (mainly dyspepsia with rare heartburn and regurgitation).  Reflux symptoms still well-controlled since reducing to Prilosec 40 mg daily. - Resume Prilosec 40 mg daily - Placed Rx for refill of Prilosec 40 mg daily, #90, RF 5 - Continue antireflux lifestyle/dietary modifications  2) Gastric ulcer Presumably 2/2 chronic Mobic use.  Resolution of upper GI symptoms with course of high-dose PPI therapy.  We discussed the role/utility of repeat upper endoscopy to document ulcer healing.  Since she had reliable symptoms prior to the endoscopy, a clear likely etiology (chronic Mobic), and resolution of symptoms with course of high-dose PPI therapy, I do not feel strongly that repeat upper endoscopy is needed at this juncture and the patient agrees.  Additionally, she has since reduced Mobic use to every other day. - Continue PPI indefinitely for gastric prophylaxis - If return of UGI symptoms, plan for repeat  upper endoscopy  3) History of colon polyps - Repeat colonoscopy in 2032 for ongoing polyp surveillance   RTC in 1 year or sooner as needed   I spent 30 minutes of time, including in depth chart review, independent review of results as outlined above, communicating results with the patient directly, face-to-face time with the patient, coordinating care, and ordering studies and medications as appropriate, and documentation.         Sandor LULLA Flatter ,DO, FACG 10/14/2024, 9:31 AM

## 2024-10-14 NOTE — Patient Instructions (Signed)
 _______________________________________________________  If your blood pressure at your visit was 140/90 or greater, please contact your primary care physician to follow up on this.  _______________________________________________________  If you are age 68 or older, your body mass index should be between 23-30. Your Body mass index is 28.81 kg/m. If this is out of the aforementioned range listed, please consider follow up with your Primary Care Provider.  If you are age 40 or younger, your body mass index should be between 19-25. Your Body mass index is 28.81 kg/m. If this is out of the aformentioned range listed, please consider follow up with your Primary Care Provider.   ________________________________________________________  The [facility] providers would like to encourage you to use MYCHART to communicate with providers for non-urgent requests or questions.  Due to long hold times on the telephone, sending your provider a message by [facility] may be a faster and more efficient way to get a response.  Please allow 48 business hours for a response.  Please remember that this is for non-urgent requests.  _______________________________________________________  [facility] is using a team-based approach to care.  Your team is made up of your doctor and two to three APPS. Our APPS (Nurse Practitioners and Physician Assistants) work with your physician to ensure care continuity for you. They are fully qualified to address your health concerns and develop a treatment plan. They communicate directly with your gastroenterologist to care for you. Seeing the Advanced Practice Practitioners on your physician's team can help you by facilitating care more promptly, often allowing for earlier appointments, access to diagnostic testing, procedures, and other specialty referrals.   We have sent the following medications to your pharmacy for you to pick up at your convenience:  Prilsec 40mg  one  tablet daily  It was a pleasure to see you today!  Vito Cirigliano, D.O.

## 2024-10-17 ENCOUNTER — Other Ambulatory Visit: Payer: Self-pay | Admitting: Internal Medicine

## 2024-10-17 DIAGNOSIS — Z1231 Encounter for screening mammogram for malignant neoplasm of breast: Secondary | ICD-10-CM

## 2024-12-05 ENCOUNTER — Ambulatory Visit
Admission: RE | Admit: 2024-12-05 | Discharge: 2024-12-05 | Disposition: A | Source: Ambulatory Visit | Attending: Internal Medicine

## 2024-12-05 DIAGNOSIS — Z1231 Encounter for screening mammogram for malignant neoplasm of breast: Secondary | ICD-10-CM
